# Patient Record
Sex: Female | Born: 1985 | Race: Black or African American | Hispanic: No | Marital: Single | Smoking: Never smoker
Health system: Southern US, Community
[De-identification: ages and names within clinical notes are randomized; demographics above are authoritative.]

## PROBLEM LIST (undated history)

## (undated) DIAGNOSIS — G43109 Migraine with aura, not intractable, without status migrainosus: Secondary | ICD-10-CM

## (undated) DIAGNOSIS — R51 Headache: Secondary | ICD-10-CM

## (undated) DIAGNOSIS — R519 Headache, unspecified: Secondary | ICD-10-CM

## (undated) HISTORY — DX: Headache, unspecified: R51.9

## (undated) HISTORY — DX: Migraine with aura, not intractable, without status migrainosus: G43.109

## (undated) HISTORY — DX: Headache: R51

## (undated) HISTORY — PX: WISDOM TOOTH EXTRACTION: SHX21

---

## 2012-03-02 ENCOUNTER — Emergency Department: Payer: Self-pay | Admitting: Emergency Medicine

## 2012-05-17 ENCOUNTER — Emergency Department: Payer: Self-pay | Admitting: Emergency Medicine

## 2012-05-17 LAB — URINALYSIS, COMPLETE
Bilirubin,UR: NEGATIVE
Glucose,UR: NEGATIVE mg/dL (ref 0–75)
Ketone: NEGATIVE
Ph: 7 (ref 4.5–8.0)
Specific Gravity: 1.015 (ref 1.003–1.030)
Squamous Epithelial: 2
WBC UR: 258 /HPF (ref 0–5)

## 2016-03-20 ENCOUNTER — Encounter: Payer: Self-pay | Admitting: Obstetrics and Gynecology

## 2016-04-04 ENCOUNTER — Ambulatory Visit (INDEPENDENT_AMBULATORY_CARE_PROVIDER_SITE_OTHER): Payer: BC Managed Care – PPO | Admitting: Obstetrics and Gynecology

## 2016-04-04 ENCOUNTER — Encounter: Payer: Self-pay | Admitting: Obstetrics and Gynecology

## 2016-04-04 VITALS — BP 89/49 | HR 75 | Ht 59.0 in | Wt 90.3 lb

## 2016-04-04 DIAGNOSIS — G4459 Other complicated headache syndrome: Secondary | ICD-10-CM | POA: Diagnosis not present

## 2016-04-04 DIAGNOSIS — Z803 Family history of malignant neoplasm of breast: Secondary | ICD-10-CM | POA: Diagnosis not present

## 2016-04-04 DIAGNOSIS — Z01419 Encounter for gynecological examination (general) (routine) without abnormal findings: Secondary | ICD-10-CM | POA: Diagnosis not present

## 2016-04-04 MED ORDER — AMITRIPTYLINE HCL 10 MG PO TABS: 10.0000 mg | ORAL_TABLET | Freq: Every day | ORAL | 2 refills | Status: DC

## 2016-04-04 NOTE — Patient Instructions (Addendum)
 Preventive Care 18-39 Years, Female Preventive care refers to lifestyle choices and visits with your health care provider that can promote health and wellness. What does preventive care include?  A yearly physical exam. This is also called an annual well check.  Dental exams once or twice a year.  Routine eye exams. Ask your health care provider how often you should have your eyes checked.  Personal lifestyle choices, including:  Daily care of your teeth and gums.  Regular physical activity.  Eating a healthy diet.  Avoiding tobacco and drug use.  Limiting alcohol use.  Practicing safe sex.  Taking vitamin and mineral supplements as recommended by your health care provider. What happens during an annual well check? The services and screenings done by your health care provider during your annual well check will depend on your age, overall health, lifestyle risk factors, and family history of disease. Counseling  Your health care provider may ask you questions about your:  Alcohol use.  Tobacco use.  Drug use.  Emotional well-being.  Home and relationship well-being.  Sexual activity.  Eating habits.  Work and work environment.  Method of birth control.  Menstrual cycle.  Pregnancy history. Screening  You may have the following tests or measurements:  Height, weight, and BMI.  Diabetes screening. This is done by checking your blood sugar (glucose) after you have not eaten for a while (fasting).  Blood pressure.  Lipid and cholesterol levels. These may be checked every 5 years starting at age 20.  Skin check.  Hepatitis C blood test.  Hepatitis B blood test.  Sexually transmitted disease (STD) testing.  BRCA-related cancer screening. This may be done if you have a family history of breast, ovarian, tubal, or peritoneal cancers.  Pelvic exam and Pap test. This may be done every 3 years starting at age 21. Starting at age 30, this may be done  every 5 years if you have a Pap test in combination with an HPV test. Discuss your test results, treatment options, and if necessary, the need for more tests with your health care provider. Vaccines  Your health care provider may recommend certain vaccines, such as:  Influenza vaccine. This is recommended every year.  Tetanus, diphtheria, and acellular pertussis (Tdap, Td) vaccine. You may need a Td booster every 10 years.  Varicella vaccine. You may need this if you have not been vaccinated.  HPV vaccine. If you are 26 or younger, you may need three doses over 6 months.  Measles, mumps, and rubella (MMR) vaccine. You may need at least one dose of MMR. You may also need a second dose.  Pneumococcal 13-valent conjugate (PCV13) vaccine. You may need this if you have certain conditions and were not previously vaccinated.  Pneumococcal polysaccharide (PPSV23) vaccine. You may need one or two doses if you smoke cigarettes or if you have certain conditions.  Meningococcal vaccine. One dose is recommended if you are age 19-21 years and a first-year college student living in a residence hall, or if you have one of several medical conditions. You may also need additional booster doses.  Hepatitis A vaccine. You may need this if you have certain conditions or if you travel or work in places where you may be exposed to hepatitis A.  Hepatitis B vaccine. You may need this if you have certain conditions or if you travel or work in places where you may be exposed to hepatitis B.  Haemophilus influenzae type b (Hib) vaccine. You may need   this if you have certain risk factors. Talk to your health care provider about which screenings and vaccines you need and how often you need them. This information is not intended to replace advice given to you by your health care provider. Make sure you discuss any questions you have with your health care provider. Document Released: 06/19/2001 Document Revised:  01/11/2016 Document Reviewed: 02/22/2015 Elsevier Interactive Patient Education  2017 Elsevier Inc.  

## 2016-04-04 NOTE — Progress Notes (Signed)
GYNECOLOGY ANNUAL PHYSICAL EXAM PROGRESS NOTE  Subjective:    Abigail Johnston is a 30 y.o. G1P0010 single female who presents to establish care, and for an annual exam. . The patient is sexually active. The patient wears seatbelts: yes. The patient participates in regular exercise: yes (recently initiated). Has the patient ever been transfused or tattooed?: no. The patient reports that there is not domestic violence in her life.    The patient wishes to address the following complaints today.  1. Sporadic headaches, several days per week, has been ongoing x 5 years.  Has worked on reducing stress levels, has had eye exams, dental exams. Uses OTC ibuprofen which helps most of the time.  Notes h/o migraines with aura (sees spots) as well occasionally, but these headaches are different.   Gynecologic History Menarche age: 41  Patient's last menstrual period was 03/28/2016. Period Cycle (Days): 28 Period Duration (Days): 5-7 Period Pattern: Regular Menstrual Flow: Moderate Dysmenorrhea: (!) Mild Dysmenorrhea Symptoms: Cramping  Contraception: condoms History of STI's: Denies Last Pap: 2 years ago (performed at [facility]). Results were: normal.  Denies h/o abnormal pap smears.    Obstetric History   G1   P0   T0   P0   A1   L0    SAB0   TAB0   Ectopic0   Multiple0   Live Births0     # Outcome Date GA Lbr Len/2nd Weight Sex Delivery Anes PTL Lv  1 AB 2012              Past Medical History:  Diagnosis Date  . Headache     Past Surgical History:  Procedure Laterality Date  . WISDOM TOOTH EXTRACTION      Family History  Problem Relation Age of Onset  . Breast cancer Maternal Grandmother 68    Social History   Social History  . Marital status: Single    Spouse name: N/A  . Number of children: N/A  . Years of education: N/A   Occupational History  . Not on file.   Social History Main Topics  . Smoking status: Never Smoker  . Smokeless  tobacco: Never Used  . Alcohol use No  . Drug use: No  . Sexual activity: Yes    Birth control/ protection: Condom   Other Topics Concern  . Not on file   Social History Narrative  . No narrative on file    No current outpatient prescriptions on file prior to visit.   No current facility-administered medications on file prior to visit.     No Known Allergies   Review of Systems Constitutional: negative for chills, fatigue, fevers and sweats Eyes: negative for irritation, redness and visual disturbance Ears, nose, mouth, throat, and face: negative for hearing loss, nasal congestion, snoring and tinnitus Respiratory: negative for asthma, cough, sputum Cardiovascular: negative for chest pain, dyspnea, exertional chest pressure/discomfort, irregular heart beat, palpitations and syncope Gastrointestinal: negative for abdominal pain, change in bowel habits, nausea and vomiting Genitourinary: negative for abnormal menstrual periods, genital lesions, sexual problems and vaginal discharge, dysuria and urinary incontinence Integument/breast: negative for breast lump, breast tenderness and nipple discharge Hematologic/lymphatic: negative for bleeding and easy bruising Musculoskeletal:negative for back pain and muscle weakness Neurological: positive for headaches.  Negative for dizziness, vertigo and weakness Endocrine: negative for diabetic symptoms including polydipsia, polyuria and skin dryness Allergic/Immunologic: negative for hay fever and urticaria        Objective:  Blood pressure Marland Kitchen)  89/49, pulse 75, height 4' 11"  (1.499 m), weight 90 lb 4.8 oz (41 kg), last menstrual period 03/28/2016. Body mass index is 18.24 kg/m.  General Appearance:    Alert, cooperative, no distress, appears stated age  Head:    Normocephalic, without obvious abnormality, atraumatic  Eyes:    PERRL, conjunctiva/corneas clear, EOM's intact, both eyes  Ears:    Normal external ear canals, both ears    Nose:   Nares normal, septum midline, mucosa normal, no drainage or sinus tenderness  Throat:   Lips, mucosa, and tongue normal; teeth and gums normal  Neck:   Supple, symmetrical, trachea midline, no adenopathy; thyroid: no enlargement/tenderness/nodules; no carotid bruit or JVD  Back:     Symmetric, no curvature, ROM normal, no CVA tenderness  Lungs:     Clear to auscultation bilaterally, respirations unlabored  Chest Wall:    No tenderness or deformity   Heart:    Regular rate and rhythm, S1 and S2 normal, no murmur, rub or gallop  Breast Exam:    No tenderness, masses, or nipple abnormality  Abdomen:     Soft, non-tender, bowel sounds active all four quadrants, no masses, no organomegaly.    Genitalia:    Pelvic:external genitalia normal, vagina without lesions, discharge, or tenderness, rectovaginal septum  normal. Cervix normal in appearance, no cervical motion tenderness, no adnexal masses or tenderness.  Uterus normal size, shape, mobile, regular contours, nontender.  Rectal:    Normal external sphincter.  No hemorrhoids appreciated. Internal exam not done.   Extremities:   Extremities normal, atraumatic, no cyanosis or edema  Pulses:   2+ and symmetric all extremities  Skin:   Skin color, texture, turgor normal, no rashes or lesions  Lymph nodes:   Cervical, supraclavicular, and axillary nodes normal  Neurologic:   CNII-XII intact, normal strength, sensation and reflexes throughout    Labs:  No results found for: WBC, HGB, HCT, MCV, PLT  No results found for: CREATININE, BUN, NA, K, CL, CO2  No results found for: ALT, AST, GGT, ALKPHOS, BILITOT   Assessment:     Routine gynecologic exam.   Persistent headaches Family h/o breast cancer  Plan:     Blood tests: CBC with diff and Comprehensive metabolic panel. Breast self exam technique reviewed and patient encouraged to perform self-exam monthly. Contraception: condoms. Discussed healthy lifestyle modifications. Pap  smear. Up to date, next needed in 2018.  Declines flu vaccine Declines STI testing Amitriptyline prescribed for headache prophylaxis, may need referral to Neurologist.  Family h/o cancer, breast cancer in maternal grandmother at young age.  Discussed that patient's risk might be elevated above general population.  Recommended that mother be tested for hereditary cancers (more specifically BRCA).   As patient is second degree relative, may not qualify for the genetic testing.  To f/u in 1 year for annual exam. F/u in 6 weeks to reassess headaches.     Rubie Maid, MD [facility]

## 2016-04-05 LAB — COMPREHENSIVE METABOLIC PANEL
ALT: 14 IU/L (ref 0–32)
AST: 31 IU/L (ref 0–40)
Albumin/Globulin Ratio: 1.6 (ref 1.2–2.2)
Albumin: 4.2 g/dL (ref 3.5–5.5)
Alkaline Phosphatase: 47 IU/L (ref 39–117)
BUN/Creatinine Ratio: 15 (ref 9–23)
BUN: 12 mg/dL (ref 6–20)
Bilirubin Total: 1.1 mg/dL (ref 0.0–1.2)
CO2: 23 mmol/L (ref 18–29)
Calcium: 9 mg/dL (ref 8.7–10.2)
Chloride: 104 mmol/L (ref 96–106)
Creatinine, Ser: 0.78 mg/dL (ref 0.57–1.00)
GFR calc Af Amer: 117 mL/min/{1.73_m2} (ref >59)
GFR, EST NON AFRICAN AMERICAN: 102 mL/min/{1.73_m2} (ref >59)
GLOBULIN, TOTAL: 2.7 g/dL (ref 1.5–4.5)
Glucose: 84 mg/dL (ref 65–99)
Potassium: 4 mmol/L (ref 3.5–5.2)
SODIUM: 141 mmol/L (ref 134–144)
Total Protein: 6.9 g/dL (ref 6.0–8.5)

## 2016-04-05 LAB — CBC
Hematocrit: 38.8 % (ref 34.0–46.6)
Hemoglobin: 13.2 g/dL (ref 11.1–15.9)
MCH: 31.2 pg (ref 26.6–33.0)
MCHC: 34 g/dL (ref 31.5–35.7)
MCV: 92 fL (ref 79–97)
Platelets: 274 10*3/uL (ref 150–379)
RBC: 4.23 x10E6/uL (ref 3.77–5.28)
RDW: 13.7 % (ref 12.3–15.4)
WBC: 3.9 10*3/uL (ref 3.4–10.8)

## 2016-04-05 LAB — VITAMIN D 25 HYDROXY (VIT D DEFICIENCY, FRACTURES): Vit D, 25-Hydroxy: 13.5 ng/mL — ABNORMAL LOW (ref 30.0–100.0)

## 2016-04-09 ENCOUNTER — Telehealth: Payer: Self-pay

## 2016-04-09 DIAGNOSIS — E559 Vitamin D deficiency, unspecified: Secondary | ICD-10-CM

## 2016-04-09 MED ORDER — VITAMIN D (ERGOCALCIFEROL) 1.25 MG (50000 UNIT) PO CAPS: 50000.0000 [IU] | ORAL_CAPSULE | ORAL | 0 refills | Status: DC

## 2016-04-09 NOTE — Telephone Encounter (Signed)
Called pt no answer. LM for pt. RX sent in.

## 2016-04-09 NOTE — Telephone Encounter (Signed)
-----   Message from Hildred LaserAnika [name], MD sent at 04/06/2016  9:09 AM EST ----- Please inform patient of Vitamin D deficiency.  Needs treatment 50,000 IU of Vit D weekly x 12 weeks, followed by OTC [phone] IU daily. Will need to recheck levels in 3 months.  This could also be a potential cause of her headaches as Vit D deficiency can be a cause of vague symptoms and general aches and pains, in addition to skeletal problems.

## 2016-06-06 ENCOUNTER — Encounter: Payer: Self-pay | Admitting: Obstetrics and Gynecology

## 2016-06-06 ENCOUNTER — Ambulatory Visit (INDEPENDENT_AMBULATORY_CARE_PROVIDER_SITE_OTHER): Payer: BC Managed Care – PPO | Admitting: Obstetrics and Gynecology

## 2016-06-06 VITALS — BP 73/46 | HR 76 | Ht 59.0 in | Wt 90.5 lb

## 2016-06-06 DIAGNOSIS — R51 Headache: Secondary | ICD-10-CM | POA: Diagnosis not present

## 2016-06-06 DIAGNOSIS — E559 Vitamin D deficiency, unspecified: Secondary | ICD-10-CM | POA: Diagnosis not present

## 2016-06-06 DIAGNOSIS — I950 Idiopathic hypotension: Secondary | ICD-10-CM

## 2016-06-06 DIAGNOSIS — R519 Headache, unspecified: Secondary | ICD-10-CM

## 2016-06-06 NOTE — Progress Notes (Signed)
    GYNECOLOGY PROGRESS NOTE  Subjective:    Patient ID: Abigail Johnston, female    DOB: 11/10/1985, 31 y.o.   MRN: [id number]  HPI  Patient is a 31 y.o. 791P0010 female who presents for 2 month f/u of persistent headaches.  Was started on Elavil last visit, however notes that she discontinued after approximately 1 week as it intensified her headaches.  Currently notes that she is still having headaches, but not as often.   Of note, patient is doing well on Vit d supplementation. Will need levels rechecked next month.   The following portions of the patient's history were reviewed and updated as appropriate: allergies, current medications, past family history, past medical history, past social history, past surgical history and problem list.  Review of Systems Pertinent items noted in HPI and remainder of comprehensive ROS otherwise negative.   Objective:   Blood pressure (!) 73/46, pulse 76, height 4\' 11"  (1.499 m), weight 90 lb 8 oz (41.1 kg), last menstrual period 05/28/2016. General appearance: alert and no distress Remainder of exam deferred.    Assessment:   Persistent (almost daily)  Headaches Hypotension Vit D deficiency  Plan:   Discussed with patient that she would likely benefit from a Neurology referral.  Will place order.  Hypotension - review of chart notes that this is patient's baseline.  Denies symptoms.   Vit D deficiency - patient to f/u in 1 month after completion of therapy for repeat labs.   Abigail LaserAnika Lopez Dentinger, MD [facility]

## 2016-06-08 DIAGNOSIS — E559 Vitamin D deficiency, unspecified: Secondary | ICD-10-CM | POA: Insufficient documentation

## 2016-06-08 DIAGNOSIS — R519 Headache, unspecified: Secondary | ICD-10-CM | POA: Insufficient documentation

## 2016-06-08 DIAGNOSIS — R51 Headache: Principal | ICD-10-CM

## 2016-06-08 DIAGNOSIS — I950 Idiopathic hypotension: Secondary | ICD-10-CM | POA: Insufficient documentation

## 2016-07-04 ENCOUNTER — Other Ambulatory Visit: Payer: BC Managed Care – PPO

## 2016-07-04 DIAGNOSIS — E559 Vitamin D deficiency, unspecified: Secondary | ICD-10-CM

## 2016-07-05 ENCOUNTER — Telehealth: Payer: Self-pay

## 2016-07-05 LAB — VITAMIN D 25 HYDROXY (VIT D DEFICIENCY, FRACTURES): Vit D, 25-Hydroxy: 56.8 ng/mL (ref 30.0–100.0)

## 2016-07-05 NOTE — Telephone Encounter (Signed)
Called pt informed her of information below, pt gave verbal understanding. Sent text to sign up for mychart.

## 2016-07-05 NOTE — Telephone Encounter (Signed)
-----   Message from Hildred LaserAnika [name], MD sent at 07/05/2016  8:34 AM EST ----- Please inform patient that her Vit D levels are now normal.  She can now just take a regular OTC supplement daily.

## 2016-07-09 ENCOUNTER — Other Ambulatory Visit: Payer: Self-pay

## 2016-07-17 ENCOUNTER — Encounter: Payer: Self-pay | Admitting: Neurology

## 2016-07-17 ENCOUNTER — Ambulatory Visit (INDEPENDENT_AMBULATORY_CARE_PROVIDER_SITE_OTHER): Payer: BC Managed Care – PPO | Admitting: Neurology

## 2016-07-17 VITALS — BP 92/58 | HR 78 | Ht 59.0 in | Wt 90.3 lb

## 2016-07-17 DIAGNOSIS — G43109 Migraine with aura, not intractable, without status migrainosus: Secondary | ICD-10-CM

## 2016-07-17 DIAGNOSIS — G44229 Chronic tension-type headache, not intractable: Secondary | ICD-10-CM

## 2016-07-17 MED ORDER — NORTRIPTYLINE HCL 10 MG PO CAPS
10.0000 mg | ORAL_CAPSULE | Freq: Every day | ORAL | 3 refills | Status: DC
Start: 2016-07-17 — End: 2016-12-12

## 2016-07-17 NOTE — Patient Instructions (Signed)
  1.  Start nortriptyline 10mg  at bedtime.  Call in 4 weeks with update and we can adjust dose if needed. 2.  Take ibuprofen as needed for headache but limited to no more than 2 days out of the week. 3.  Limit use of pain relievers to no more than 2 days out of the week.  These medications include acetaminophen, ibuprofen, triptans and narcotics.  This will help reduce risk of rebound headaches. 4.  Be aware of common food triggers such as processed sweets, processed foods with nitrites (such as deli meat, hot dogs, sausages), foods with MSG, alcohol (such as wine), chocolate, certain cheeses, certain fruits (dried fruits, some citrus fruit), vinegar, diet soda. 4.  Avoid caffeine 5.  Routine exercise 6.  Proper sleep hygiene 7.  Stay adequately hydrated with water 8.  Keep a headache diary. 9.  Maintain proper stress management. 10.  Do not skip meals. 11.  Consider supplements:  Magnesium citrate 400mg  to 600mg  daily, riboflavin 400mg , Coenzyme Q 10 100mg  three times daily 12.  Follow up in 3 months.

## 2016-07-17 NOTE — Progress Notes (Signed)
NEUROLOGY CONSULTATION NOTE  Abigail Johnston MRN: [id number] DOB: 10-27-1985  Referring provider: Dr. Valentino Saxon Primary care provider: Dr. Valentino Saxon  Reason for consult:  headache  HISTORY OF PRESENT ILLNESS: Abigail Johnston is a 31 year old female who presents for headache.  History supplemented by PCP note.  Onset:  adolescence Location:  Varies (back of head, temples, front) Quality:  nonthrobbing Intensity:  7/10 Aura:  no Prodrome:  no Associated symptoms:  No.  She has not had any new worse headache of her life, waking up from sleep Duration:  1 hour with ibuprofen Frequency:  4 days a week Frequency of abortive medication: 4 days a week Triggers/exacerbating factors:  Loud sounds Relieving factors:  Laying down Activity:  Functions.  She also has had infrequent migraines (total of 3 in her life).  They are 9-10/10 pounding and associated with seeing scotomas, nausea, vomiting, photophobia and phonophobia.   Past NSAIDS:  no Past analgesics:  Excedrin Past abortive triptans:  no Past muscle relaxants:  no Past anti-emetic:  no Past antihypertensive medications:  no Past antidepressant medications:  Amitriptyline 10mg  (for 1 week, made her feel worse) Past anticonvulsant medications:  no Past vitamins/Herbal/Supplements:  no Past antihistamines/decongestants:  no Other past therapies:  no  Current NSAIDS:  ibuprofen Current analgesics:  no Current triptans:  no Current anti-emetic:  no Current muscle relaxants:  no Current anti-anxiolytic:  no Current sleep aide:  no Current Antihypertensive medications:  no Current Antidepressant medications:  no Current Anticonvulsant medications:  no Current Vitamins/Herbal/Supplements:  vitamin D Current Antihistamines/Decongestants:  no Other therapy:  no  Caffeine:  soda Alcohol:  no Smoker:  no Diet:  Does not hydrate Exercise:  no Depression/anxiety:  no Sleep hygiene:  good Family history of headache:   no  Labs from 04/04/16:  CMP with Na 141, K 4, Cl 104, CO2 23, glucose 84, BUN 12, Cr 0.78, Total bili 1.1, ALP 47, AST 31, ALT 14.  PAST MEDICAL HISTORY: Past Medical History:  Diagnosis Date  . Headache   . Migraine headache with aura     PAST SURGICAL HISTORY: Past Surgical History:  Procedure Laterality Date  . WISDOM TOOTH EXTRACTION      MEDICATIONS: Current Outpatient Prescriptions on File Prior to Visit  Medication Sig Dispense Refill  . ibuprofen (ADVIL,MOTRIN) 200 MG tablet Take 200 mg by mouth every 6 (six) hours as needed.    Marland Kitchen amitriptyline (ELAVIL) 10 MG tablet Take 1 tablet (10 mg total) by mouth at bedtime. (Patient not taking: Reported on 06/06/2016) 30 tablet 2  . Vitamin D, Ergocalciferol, (DRISDOL) 50000 units CAPS capsule Take 1 capsule (50,000 Units total) by mouth every 7 (seven) days. (Patient not taking: Reported on 07/17/2016) 12 capsule 0   No current facility-administered medications on file prior to visit.     ALLERGIES: No Known Allergies  FAMILY HISTORY: Family History  Problem Relation Age of Onset  . Breast cancer Maternal Grandmother     SOCIAL HISTORY: Social History   Social History  . Marital status: Single    Spouse name: N/A  . Number of children: N/A  . Years of education: N/A   Occupational History  . Not on file.   Social History Main Topics  . Smoking status: Never Smoker  . Smokeless tobacco: Never Used  . Alcohol use No  . Drug use: No  . Sexual activity: Yes    Birth control/ protection: Condom   Other Topics Concern  . Not  on file   Social History Narrative  . No narrative on file    REVIEW OF SYSTEMS: Constitutional: No fevers, chills, or sweats, no generalized fatigue, change in appetite Eyes: No visual changes, double vision, eye pain Ear, nose and throat: No hearing loss, ear pain, nasal congestion, sore throat Cardiovascular: No chest pain, palpitations Respiratory:  No shortness of breath at rest or  with exertion, wheezes GastrointestinaI: No nausea, vomiting, diarrhea, abdominal pain, fecal incontinence Genitourinary:  No dysuria, urinary retention or frequency Musculoskeletal:  No neck pain, back pain Integumentary: No rash, pruritus, skin lesions Neurological: as above Psychiatric: No depression, insomnia, anxiety Endocrine: No palpitations, fatigue, diaphoresis, mood swings, change in appetite, change in weight, increased thirst Hematologic/Lymphatic:  No purpura, petechiae. Allergic/Immunologic: no itchy/runny eyes, nasal congestion, recent allergic reactions, rashes  PHYSICAL EXAM: Vitals:   07/17/16 1341  BP: (!) 92/58  Pulse: 78   General: No acute distress.  Patient appears well-groomed.  Head:  Normocephalic/atraumatic Eyes:  fundi examined but not visualized Neck: supple, no paraspinal tenderness, full range of motion Back: No paraspinal tenderness Heart: regular rate and rhythm Lungs: Clear to auscultation bilaterally. Vascular: No carotid bruits. Neurological Exam: Mental status: alert and oriented to person, place, and time, recent and remote memory intact, fund of knowledge intact, attention and concentration intact, speech fluent and not dysarthric, language intact. Cranial nerves: CN I: not tested CN II: pupils equal, round and reactive to light, visual fields intact CN III, IV, VI:  full range of motion, no nystagmus, no ptosis CN V: facial sensation intact CN VII: upper and lower face symmetric CN VIII: hearing intact CN IX, X: gag intact, uvula midline CN XI: sternocleidomastoid and trapezius muscles intact CN XII: tongue midline Bulk & Tone: normal, no fasciculations. Motor:  5/5 throughout  Sensation: temperature and vibration sensation intact. Deep Tendon Reflexes:  2+ throughout, toes downgoing.  Finger to nose testing:  Without dysmetria.  Heel to shin:  Without dysmetria.  Gait:  Normal station and stride.  Able to turn and tandem walk.  Romberg negative.  IMPRESSION: 1.  Chronic tension-type headache 2.  Migraine with aura, infrequent  PLAN: 1.  Will try nortriptyline 10mg  at bedtime (better tolerated than amitriptyline) 2.  Limit ibuprofen to no more than 2 days out of the week 3.  Increase exercise and water, stop soda 4.  Follow up in 3 months.  Thank you for allowing me to take part in the care of this patient.  Shon MilletAdam Aunesty Tyson, DO  CC:  Hildred LaserAnika [name], MD

## 2016-10-11 ENCOUNTER — Ambulatory Visit (INDEPENDENT_AMBULATORY_CARE_PROVIDER_SITE_OTHER): Payer: BC Managed Care – PPO | Admitting: Obstetrics and Gynecology

## 2016-10-11 ENCOUNTER — Encounter: Payer: Self-pay | Admitting: Obstetrics and Gynecology

## 2016-10-11 VITALS — BP 85/56 | HR 81 | Ht 59.0 in | Wt 95.6 lb

## 2016-10-11 DIAGNOSIS — Z30011 Encounter for initial prescription of contraceptive pills: Secondary | ICD-10-CM

## 2016-10-11 NOTE — Patient Instructions (Addendum)
Oral Contraception Use Oral contraceptive pills (OCPs) are medicines taken to prevent pregnancy. OCPs work by preventing the ovaries from releasing eggs. The hormones in OCPs also cause the cervical mucus to thicken, preventing the sperm from entering the uterus. The hormones also cause the uterine lining to become thin, not allowing a fertilized egg to attach to the inside of the uterus. OCPs are highly effective when taken exactly as prescribed. However, OCPs do not prevent sexually transmitted diseases (STDs). Safe sex practices, such as using condoms along with an OCP, can help prevent STDs. Before taking OCPs, you may have a physical exam and Pap test. Your health care provider may also order blood tests if necessary. Your health care provider will make sure you are a good candidate for oral contraception. Discuss with your health care provider the possible side effects of the OCP you may be prescribed. When starting an OCP, it can take 2 to 3 months for the body to adjust to the changes in hormone levels in your body. How to take oral contraceptive pills Your health care provider may advise you on how to start taking the first cycle of OCPs. Otherwise, you can:  Start on day 1 of your menstrual period. You will not need any backup contraceptive protection with this start time.  Start on the first Sunday after your menstrual period or the day you get your prescription. In these cases, you will need to use backup contraceptive protection for the first week.  Start the pill at any time of your cycle. If you take the pill within 5 days of the start of your period, you are protected against pregnancy right away. In this case, you will not need a backup form of birth control. If you start at any other time of your menstrual cycle, you will need to use another form of birth control for 7 days. If your OCP is the type called a minipill, it will protect you from pregnancy after taking it for 2 days (48  hours).  After you have started taking OCPs:  If you forget to take 1 pill, take it as soon as you remember. Take the next pill at the regular time.  If you miss 2 or more pills, call your health care provider because different pills have different instructions for missed doses. Use backup birth control until your next menstrual period starts.  If you use a 28-day pack that contains inactive pills and you miss 1 of the last 7 pills (pills with no hormones), it will not matter. Throw away the rest of the non-hormone pills and start a new pill pack.  No matter which day you start the OCP, you will always start a new pack on that same day of the week. Have an extra pack of OCPs and a backup contraceptive method available in case you miss some pills or lose your OCP pack. Follow these instructions at home:  Do not smoke.  Always use a condom to protect against STDs. OCPs do not protect against STDs.  Use a calendar to mark your menstrual period days.  Read the information and directions that came with your OCP. Talk to your health care provider if you have questions. Contact a health care provider if:  You develop nausea and vomiting.  You have abnormal vaginal discharge or bleeding.  You develop a rash.  You miss your menstrual period.  You are losing your hair.  You need treatment for mood swings or depression.  You   get dizzy when taking the OCP.  You develop acne from taking the OCP.  You become pregnant. Get help right away if:  You develop chest pain.  You develop shortness of breath.  You have an uncontrolled or severe headache.  You develop numbness or slurred speech.  You develop visual problems.  You develop pain, redness, and swelling in the legs. This information is not intended to replace advice given to you by your health care provider. Make sure you discuss any questions you have with your health care provider. Document Released: 04/12/2011 Document  Revised: 09/29/2015 Document Reviewed: 10/12/2012 Elsevier Interactive Patient Education  2017 [employer]  

## 2016-10-11 NOTE — Progress Notes (Signed)
    GYNECOLOGY CLINIC PROGRESS NOTE Subjective:    Abigail Johnston is a 31 y.o. 441P0010 female who presents for contraception counseling. The patient has no complaints today. The patient is not sexually active. Pertinent past medical history: none (had persistent headaches but now resolved after having vision assessment, no h/o migraines).  Menstrual History: OB History    Gravida Para Term Preterm AB Living   1       1     SAB TAB Ectopic Multiple Live Births                  Menarche age: [age] Patient's last menstrual period was 09/15/2016.    The following portions of the patient's history were reviewed and updated as appropriate: allergies, current medications, past family history, past medical history, past social history, past surgical history and problem list.  Review of Systems Pertinent items noted in HPI and remainder of comprehensive ROS otherwise negative.   Objective:    BP (!) 85/56 (BP Location: Left Arm, Patient Position: Sitting, Cuff Size: Normal)   Pulse 81   Ht 4\' 11"  (1.499 m)   Wt 95 lb 9.6 oz (43.4 kg)   LMP 09/15/2016   BMI 19.31 kg/m  General appearance: alert and no distress Head: Normocephalic, without obvious abnormality, atraumatic Neck: no adenopathy, no carotid bruit, no JVD, supple, symmetrical, trachea midline and thyroid not enlarged, symmetric, no tenderness/mass/nodules Lungs: clear to auscultation bilaterally Heart: regular rate and rhythm, S1, S2 normal, no murmur, click, rub or gallop Abdomen: soft, non-tender; bowel sounds normal; no masses,  no organomegaly Pelvic: deferred Extremities: extremities normal, atraumatic, no cyanosis or edema Neurologic: Grossly normal   Assessment:    31 y.o., starting OCP (estrogen/progesterone), no contraindications.   Plan:   - Contraception: OCP (estrogen/progesterone). Discussed risks and benefits of OCPs. Reviewed all other forms of birth control.  Given samples of Taytulla x 2 months.  Advised on back up contraception during use of samples due to recent recall (batch with switched placebo and active row).  If OCPs work for patient, can prescribe alternative brand. Advised on Sunday start as next menses is due sometime next week.  - F/u in 2 months to reassess contraception.    Hildred Laserherry, Dorothey Oetken, MD [facility] =

## 2016-10-18 ENCOUNTER — Ambulatory Visit: Payer: BC Managed Care – PPO | Admitting: Neurology

## 2016-12-12 ENCOUNTER — Encounter: Payer: Self-pay | Admitting: Obstetrics and Gynecology

## 2016-12-12 ENCOUNTER — Ambulatory Visit (INDEPENDENT_AMBULATORY_CARE_PROVIDER_SITE_OTHER): Payer: BC Managed Care – PPO | Admitting: Obstetrics and Gynecology

## 2016-12-12 VITALS — BP 78/52 | HR 83 | Ht 59.0 in | Wt 94.3 lb

## 2016-12-12 DIAGNOSIS — T887XXA Unspecified adverse effect of drug or medicament, initial encounter: Secondary | ICD-10-CM

## 2016-12-12 DIAGNOSIS — Z3041 Encounter for surveillance of contraceptive pills: Secondary | ICD-10-CM

## 2016-12-12 DIAGNOSIS — T50905A Adverse effect of unspecified drugs, medicaments and biological substances, initial encounter: Secondary | ICD-10-CM

## 2016-12-12 NOTE — Progress Notes (Signed)
    GYNECOLOGY PROGRESS NOTE  Subjective:    Patient ID: Abigail Johnston, female    DOB: 08/18/1985, 31 y.o.   MRN: [id number]  HPI  Patient is a 31 y.o. [id number] female who presents for 2 month f/u of contraception.  She was given samples of Taytulla last visit.  Patient notes that she experiences nausea almost daily, even despite now taking the pills at night.  This is very bothersome as the nausea lasts all day. Denies other symptoms.  Has no issues with compliance.   The following portions of the patient's history were reviewed and updated as appropriate: allergies, current medications, past family history, past medical history, past social history, past surgical history and problem list.  Review of Systems Pertinent items noted in HPI and remainder of comprehensive ROS otherwise negative.       Objective:   Blood pressure (!) 78/52, pulse 83, height 4\' 11"  (1.499 m), weight 94 lb 4.8 oz (42.8 kg), last menstrual period 11/11/2016. General appearance: alert and no distress Exam deferred.   Assessment:   Side effects of medication Contraception management.   Plan:   Discussed options for management, including changing to a different OCP, or switching to a different birth control method.  Patient would lie to stick with pills for now.  Changed to Lo-Loestrin (given 2 month samples).  If this works better, patient can call for prescription.  If not, also given handout on different options for contraception, including abstinence; over the counter/barrier methods; hormonal contraceptive medication including pill (possibly progesterone only), patch, ring, injection,contraceptive implant; hormonal and nonhormonal IUDs.  Risks and benefits reviewed.  Questions were answered.  Information was given to patient to review.   Follow up if symptoms worsen or fail to improve.    Hildred Laserherry, Elmus Mathes, MD [facility]

## 2017-02-04 ENCOUNTER — Telehealth: Payer: Self-pay | Admitting: Obstetrics and Gynecology

## 2017-02-04 MED ORDER — NORETHIN-ETH ESTRAD-FE BIPHAS 1 MG-10 MCG / 10 MCG PO TABS: 1.0000 | ORAL_TABLET | Freq: Every day | ORAL | 1 refills | Status: DC

## 2017-02-04 NOTE — Telephone Encounter (Signed)
Med erx with 6 refills. Annual exam scheduled for 12/7 at 8am.

## 2017-02-04 NOTE — Telephone Encounter (Signed)
Patient called and stated that she was advised to call if her recent birth sample is working for her, The patient stated that (lo lo estrin) is working great for her and that she has about 1 week left, Then she will need more. The patient did not disclose any other information other than wanting a prescription of the birth control/ Please advise.

## 2017-02-04 NOTE — Telephone Encounter (Signed)
lmtrc

## 2017-04-12 ENCOUNTER — Encounter: Payer: BC Managed Care – PPO | Admitting: Obstetrics and Gynecology

## 2017-05-09 ENCOUNTER — Ambulatory Visit (INDEPENDENT_AMBULATORY_CARE_PROVIDER_SITE_OTHER): Payer: BC Managed Care – PPO | Admitting: Obstetrics and Gynecology

## 2017-05-09 ENCOUNTER — Encounter: Payer: Self-pay | Admitting: Obstetrics and Gynecology

## 2017-05-09 VITALS — BP 91/58 | HR 96 | Ht 59.0 in | Wt 96.4 lb

## 2017-05-09 DIAGNOSIS — Z113 Encounter for screening for infections with a predominantly sexual mode of transmission: Secondary | ICD-10-CM | POA: Diagnosis not present

## 2017-05-09 DIAGNOSIS — Z124 Encounter for screening for malignant neoplasm of cervix: Secondary | ICD-10-CM | POA: Diagnosis not present

## 2017-05-09 DIAGNOSIS — N941 Unspecified dyspareunia: Secondary | ICD-10-CM

## 2017-05-09 DIAGNOSIS — B9689 Other specified bacterial agents as the cause of diseases classified elsewhere: Secondary | ICD-10-CM | POA: Diagnosis not present

## 2017-05-09 DIAGNOSIS — B379 Candidiasis, unspecified: Secondary | ICD-10-CM

## 2017-05-09 DIAGNOSIS — Z01419 Encounter for gynecological examination (general) (routine) without abnormal findings: Secondary | ICD-10-CM

## 2017-05-09 DIAGNOSIS — N760 Acute vaginitis: Secondary | ICD-10-CM | POA: Diagnosis not present

## 2017-05-09 MED ORDER — SECNIDAZOLE 2 G PO PACK: 1.0000 | PACK | Freq: Once | ORAL | 0 refills | Status: AC

## 2017-05-09 MED ORDER — FLUCONAZOLE 150 MG PO TABS: 150.0000 mg | ORAL_TABLET | Freq: Once | ORAL | 3 refills | Status: AC

## 2017-05-09 NOTE — Patient Instructions (Signed)

## 2017-05-09 NOTE — Progress Notes (Signed)
GYNECOLOGY ANNUAL PHYSICAL EXAM PROGRESS NOTE  Subjective:    Abigail Johnston is a 32 y.o. [id number] female who presents for an annual exam. The patient is sexually active. The patient wears seatbelts: yes. The patient participates in regular exercise: no. Has the patient ever been transfused or tattooed?: yes. The patient reports that there is not domestic violence in her life.   The patient has the following complaints today:  1. Dyspareunia with intercourse since October. Is independent of sexual position. Has not changed partners, has not engaged in any new methods or rough intercourse.   Gynecologic History Menarche age: [age] Patient's last menstrual period was 04/12/2017. Contraception: OCP (estrogen/progesterone) History of STI's: Denies Last Pap: cannot recall last pap smear. Results were: normal.  Denies h/o abnormal pap smears.    Obstetric History   G1   P0   T0   P0   A1   L0    SAB0   TAB0   Ectopic0   Multiple0   Live Births0     # Outcome Date GA Lbr Len/2nd Weight Sex Delivery Anes PTL Lv  1 AB 2012              Past Medical History:  Diagnosis Date  . Headache   . Migraine headache with aura     Past Surgical History:  Procedure Laterality Date  . WISDOM TOOTH EXTRACTION      Family History  Problem Relation Age of Onset  . Breast cancer Maternal Grandmother     Social History   Socioeconomic History  . Marital status: Single    Spouse name: Not on file  . Number of children: Not on file  . Years of education: Not on file  . Highest education level: Not on file  Social Needs  . Financial resource strain: Not on file  . Food insecurity - worry: Not on file  . Food insecurity - inability: Not on file  . Transportation needs - medical: Not on file  . Transportation needs - non-medical: Not on file  Occupational History  . Not on file  Tobacco Use  . Smoking status: Never Smoker  . Smokeless tobacco: Never Used  Substance and Sexual  Activity  . Alcohol use: No  . Drug use: No  . Sexual activity: Yes    Birth control/protection: Condom, Pill  Other Topics Concern  . Not on file  Social History Narrative  . Not on file    Current Outpatient Medications on File Prior to Visit  Medication Sig Dispense Refill  . Norethindrone-Ethinyl Estradiol-Fe Biphas (LO LOESTRIN FE) 1 MG-10 MCG / 10 MCG tablet Take 1 tablet by mouth daily. 3 Package 1   No current facility-administered medications on file prior to visit.     No Known Allergies   Review of Systems Constitutional: negative for chills, fatigue, fevers and sweats Eyes: negative for irritation, redness and visual disturbance Ears, nose, mouth, throat, and face: negative for hearing loss, nasal congestion, snoring and tinnitus Respiratory: negative for asthma, cough, sputum Cardiovascular: negative for chest pain, dyspnea, exertional chest pressure/discomfort, irregular heart beat, palpitations and syncope Gastrointestinal: negative for abdominal pain, change in bowel habits, nausea and vomiting Genitourinary: Positive for dyspareunia (since October) and vaginal discharge (x 4 days, mildly itchy).  Negative for abnormal menstrual periods, genital lesions, dysuria and urinary incontinence Integument/breast: negative for breast lump, breast tenderness and nipple discharge Hematologic/lymphatic: negative for bleeding and easy bruising Musculoskeletal:negative for back pain and muscle  weakness Neurological: negative for dizziness, headaches, vertigo and weakness Endocrine: negative for diabetic symptoms including polydipsia, polyuria and skin dryness Allergic/Immunologic: negative for hay fever and urticaria        Objective:  Blood pressure (!) 91/58, pulse 96, height 4\' 11"  (1.499 m), weight 96 lb 6.4 oz (43.7 kg), last menstrual period 04/12/2017. Body mass index is 19.47 kg/m.  General Appearance:    Alert, cooperative, no distress, appears stated age  Head:     Normocephalic, without obvious abnormality, atraumatic  Eyes:    PERRL, conjunctiva/corneas clear, EOM's intact, both eyes  Ears:    Normal external ear canals, both ears  Nose:   Nares normal, septum midline, mucosa normal, no drainage or sinus tenderness  Throat:   Lips, mucosa, and tongue normal; teeth and gums normal  Neck:   Supple, symmetrical, trachea midline, no adenopathy; thyroid: no enlargement/tenderness/nodules; no carotid bruit or JVD  Back:     Symmetric, no curvature, ROM normal, no CVA tenderness  Lungs:     Clear to auscultation bilaterally, respirations unlabored  Chest Wall:    No tenderness or deformity   Heart:    Regular rate and rhythm, S1 and S2 normal, no murmur, rub or gallop  Breast Exam:    No tenderness, masses, or nipple abnormality  Abdomen:     Soft, non-tender, bowel sounds active all four quadrants, no masses, no organomegaly.    Genitalia:    Pelvic:external genitalia normal, vagina without lesions, or tenderness.  Moderate amount of thick clumpy white-gray discharge. Rectovaginal septum  normal. Cervix normal in appearance, no cervical motion tenderness, no adnexal masses or tenderness.  Uterus normal size, shape, mobile, regular contours, nontender.  Rectal:    Normal external sphincter.  No hemorrhoids appreciated. Internal exam not done.   Extremities:   Extremities normal, atraumatic, no cyanosis or edema  Pulses:   2+ and symmetric all extremities  Skin:   Skin color, texture, turgor normal, no rashes or lesions  Lymph nodes:   Cervical, supraclavicular, and axillary nodes normal  Neurologic:   CNII-XII intact, normal strength, sensation and reflexes throughout    Microscopic wet-mount exam shows clue cells, hyphae. hows KOH done. No trichomonads or yeast.    Labs:  Lab Results  Component Value Date   WBC 3.9 04/04/2016   HGB 13.2 04/04/2016   HCT 38.8 04/04/2016   MCV 92 04/04/2016   PLT 274 04/04/2016    Lab Results  Component Value  Date   CREATININE 0.78 04/04/2016   BUN 12 04/04/2016   NA 141 04/04/2016   K 4.0 04/04/2016   CL 104 04/04/2016   CO2 23 04/04/2016    Lab Results  Component Value Date   ALT 14 04/04/2016   AST 31 04/04/2016   ALKPHOS 47 04/04/2016   BILITOT 1.1 04/04/2016    No results found for: TSH   Assessment:    Healthy female exam.   Dyspareunia Bacterial vaginosis Vagina yeast infection Cervical cancer screening  Plan:     Blood tests: CBC with diff and Comprehensive metabolic panel. Breast self exam technique reviewed and patient encouraged to perform self-exam monthly. Contraception: OCP (estrogen/progesterone). Discussed healthy lifestyle modifications. Pap smear performed today.   Prescribed Solozec for bacterial vaginosis, Diflucan for yeast infection.  Dyspareunia likely secondary to vaginitis, but once infections cleared, to notify MD if symptoms persist.      Hildred Laser, MD [facility]

## 2017-05-10 ENCOUNTER — Other Ambulatory Visit: Payer: Self-pay

## 2017-05-10 LAB — PAP IG AND HPV HIGH-RISK
HPV, HIGH-RISK: POSITIVE — AB
PAP SMEAR COMMENT: 0

## 2017-05-10 LAB — COMPREHENSIVE METABOLIC PANEL
ALBUMIN: 4.5 g/dL (ref 3.5–5.5)
ALT: 14 IU/L (ref 0–32)
AST: 17 IU/L (ref 0–40)
Albumin/Globulin Ratio: 1.4 (ref 1.2–2.2)
Alkaline Phosphatase: 33 IU/L — ABNORMAL LOW (ref 39–117)
BUN / CREAT RATIO: 12 (ref 9–23)
BUN: 10 mg/dL (ref 6–20)
Bilirubin Total: 0.7 mg/dL (ref 0.0–1.2)
CALCIUM: 9.5 mg/dL (ref 8.7–10.2)
CO2: 22 mmol/L (ref 20–29)
CREATININE: 0.81 mg/dL (ref 0.57–1.00)
Chloride: 106 mmol/L (ref 96–106)
GFR, EST AFRICAN AMERICAN: 111 mL/min/{1.73_m2} (ref >59)
GFR, EST NON AFRICAN AMERICAN: 96 mL/min/{1.73_m2} (ref >59)
GLOBULIN, TOTAL: 3.2 g/dL (ref 1.5–4.5)
GLUCOSE: 90 mg/dL (ref 65–99)
Potassium: 3.9 mmol/L (ref 3.5–5.2)
SODIUM: 143 mmol/L (ref 134–144)
TOTAL PROTEIN: 7.7 g/dL (ref 6.0–8.5)

## 2017-05-10 LAB — CBC
HEMOGLOBIN: 14.4 g/dL (ref 11.1–15.9)
Hematocrit: 41.1 % (ref 34.0–46.6)
MCH: 31.9 pg (ref 26.6–33.0)
MCHC: 35 g/dL (ref 31.5–35.7)
MCV: 91 fL (ref 79–97)
Platelets: 274 10*3/uL (ref 150–379)
RBC: 4.52 x10E6/uL (ref 3.77–5.28)
RDW: 13.7 % (ref 12.3–15.4)
WBC: 5.2 10*3/uL (ref 3.4–10.8)

## 2017-05-10 MED ORDER — METRONIDAZOLE 0.75 % VA GEL: 1.0000 | Freq: Every day | VAGINAL | 0 refills | Status: DC

## 2017-05-16 LAB — HPV GENOTYPES 16/18,45
HPV GENOTYPE 16: NEGATIVE
HPV GENOTYPE 18,45: NEGATIVE

## 2017-05-16 LAB — SPECIMEN STATUS REPORT

## 2017-05-17 ENCOUNTER — Telehealth: Payer: Self-pay | Admitting: Obstetrics and Gynecology

## 2017-05-17 NOTE — Telephone Encounter (Signed)
The patient called in regards to her results from 05/09/17.The patient would like to have a call back from a nurse.  Please advise.

## 2017-05-17 NOTE — Telephone Encounter (Signed)
Pts questions answered.  

## 2017-05-17 NOTE — Telephone Encounter (Signed)
lmtrc

## 2017-07-25 ENCOUNTER — Other Ambulatory Visit: Payer: Self-pay | Admitting: Obstetrics and Gynecology

## 2017-07-25 NOTE — Telephone Encounter (Signed)
Error

## 2018-03-26 ENCOUNTER — Encounter: Payer: Self-pay | Admitting: Obstetrics and Gynecology

## 2018-03-26 ENCOUNTER — Ambulatory Visit: Payer: BC Managed Care – PPO | Admitting: Obstetrics and Gynecology

## 2018-03-26 VITALS — BP 91/62 | HR 74 | Ht 59.0 in | Wt 98.6 lb

## 2018-03-26 DIAGNOSIS — N644 Mastodynia: Secondary | ICD-10-CM

## 2018-03-26 NOTE — Patient Instructions (Signed)

## 2018-03-26 NOTE — Progress Notes (Signed)
Pt stated that she is having right breast pain and burning sensation in the nipple area x 3 days. No other complaints,

## 2018-03-26 NOTE — Progress Notes (Signed)
    GYNECOLOGY PROGRESS NOTE  Subjective:    Patient ID: Abigail Johnston, female    DOB: 07/09/1985, 32 y.o.   MRN: [id number]  HPI  Patient is a 32 y.o. [id number] female who presents for right breast pain x 1 day and burning x 2 days.  Denies any nipple discharge, lumps in breast. Has had this happen once before, right after her cycle went off. Denies personal history of breast issues. Family history significant for breast cancer in grandmother (passed in late 4330s). Discontinued contraception in May (noted that it had an affect on her libido).  Tried condoms but noted vaginal irritation.   The following portions of the patient's history were reviewed and updated as appropriate: allergies, current medications, past family history, past medical history, past social history, past surgical history and problem list.  Review of Systems Pertinent items noted in HPI and remainder of comprehensive ROS otherwise negative.   Objective:   Blood pressure 91/62, pulse 74, height 4\' 11"  (1.499 m), weight 98 lb 9.6 oz (44.7 kg), last menstrual period 03/17/2018. General appearance: alert and no distress  Breasts: breasts appear normal, no suspicious masses, no skin or nipple changes or axillary nodes.  Assessment:   Mastalgia  Plan:   - Discussion had with patient regarding monitoring her symptoms in relation to her cycle, decreasing caffeine intake if applicable, use of Vitamin E, cold compresses, NSAIDs, etc.  - To f/u in 1 month if symptoms have not improved.   Hildred Laserherry, Ernestine Langworthy, MD [facility]

## 2018-04-23 ENCOUNTER — Encounter: Payer: BC Managed Care – PPO | Admitting: Obstetrics and Gynecology

## 2018-05-14 ENCOUNTER — Encounter: Payer: BC Managed Care – PPO | Admitting: Obstetrics and Gynecology

## 2018-06-10 ENCOUNTER — Encounter: Payer: BC Managed Care – PPO | Admitting: Obstetrics and Gynecology

## 2018-07-28 ENCOUNTER — Encounter: Payer: BC Managed Care – PPO | Admitting: Obstetrics and Gynecology

## 2018-08-09 ENCOUNTER — Other Ambulatory Visit: Payer: Self-pay | Admitting: Obstetrics and Gynecology

## 2018-09-24 ENCOUNTER — Encounter: Payer: BC Managed Care – PPO | Admitting: Obstetrics and Gynecology

## 2018-12-02 ENCOUNTER — Encounter: Payer: BC Managed Care – PPO | Admitting: Obstetrics and Gynecology

## 2019-02-18 ENCOUNTER — Other Ambulatory Visit: Payer: Self-pay

## 2019-02-18 DIAGNOSIS — Z20822 Contact with and (suspected) exposure to covid-19: Secondary | ICD-10-CM

## 2019-02-20 LAB — NOVEL CORONAVIRUS, NAA: SARS-CoV-2, NAA: NOT DETECTED

## 2019-02-25 ENCOUNTER — Encounter: Payer: Self-pay | Admitting: Obstetrics and Gynecology

## 2019-02-25 ENCOUNTER — Other Ambulatory Visit: Payer: Self-pay

## 2019-02-25 ENCOUNTER — Ambulatory Visit: Payer: BC Managed Care – PPO | Admitting: Obstetrics and Gynecology

## 2019-02-25 VITALS — BP 99/67 | HR 85 | Ht 59.5 in | Wt 96.5 lb

## 2019-02-25 DIAGNOSIS — N939 Abnormal uterine and vaginal bleeding, unspecified: Secondary | ICD-10-CM | POA: Diagnosis not present

## 2019-02-25 DIAGNOSIS — Z8669 Personal history of other diseases of the nervous system and sense organs: Secondary | ICD-10-CM | POA: Diagnosis not present

## 2019-02-25 DIAGNOSIS — Z3201 Encounter for pregnancy test, result positive: Secondary | ICD-10-CM | POA: Diagnosis not present

## 2019-02-25 DIAGNOSIS — N926 Irregular menstruation, unspecified: Secondary | ICD-10-CM | POA: Diagnosis not present

## 2019-02-25 LAB — POCT URINE PREGNANCY: Preg Test, Ur: POSITIVE — AB

## 2019-02-25 NOTE — Patient Instructions (Addendum)
Morning Sickness  Morning sickness is when you feel sick to your stomach (nauseous) during pregnancy. You may feel sick to your stomach and throw up (vomit). You may feel sick in the morning, but you can feel this way at any time of day. Some women feel very sick to their stomach and cannot stop throwing up (hyperemesis gravidarum). Follow these instructions at home: Medicines  Take over-the-counter and prescription medicines only as told by your doctor. Do not take any medicines until you talk with your doctor about them first.  Taking multivitamins before getting pregnant can stop or lessen the harshness of morning sickness. Eating and drinking  Eat dry toast or crackers before getting out of bed.  Eat 5 or 6 small meals a day.  Eat dry and bland foods like rice and baked potatoes.  Do not eat greasy, fatty, or spicy foods.  Have someone cook for you if the smell of food causes you to feel sick or throw up.  If you feel sick to your stomach after taking prenatal vitamins, take them at night or with a snack.  Eat protein when you need a snack. Nuts, yogurt, and cheese are good choices.  Drink fluids throughout the day.  Try ginger ale made with real ginger, ginger tea made from fresh grated ginger, or ginger candies. General instructions  Do not use any products that have nicotine or tobacco in them, such as cigarettes and e-cigarettes. If you need help quitting, ask your doctor.  Use an air purifier to keep the air in your house free of smells.  Get lots of fresh air.  Try to avoid smells that make you feel sick.  Try: ? Wearing a bracelet that is used for seasickness (acupressure wristband). ? Going to a doctor who puts thin needles into certain body points (acupuncture) to improve how you feel. Contact a doctor if:  You need medicine to feel better.  You feel dizzy or light-headed.  You are losing weight. Get help right away if:  You feel very sick to your  stomach and cannot stop throwing up.  You pass out (faint).  You have very bad pain in your belly. Summary  Morning sickness is when you feel sick to your stomach (nauseous) during pregnancy.  You may feel sick in the morning, but you can feel this way at any time of day.  Making some changes to what you eat may help your symptoms go away. This information is not intended to replace advice given to you by your health care provider. Make sure you discuss any questions you have with your health care provider. Document Released: 05/31/2004 Document Revised: 04/05/2017 Document Reviewed: 05/24/2016 Elsevier Patient Education  2020 [employer]. How a Baby Grows During Pregnancy  Pregnancy begins when a female's sperm enters a female's egg (fertilization). Fertilization usually happens in one of the tubes (fallopian tubes) that connect the ovaries to the womb (uterus). The fertilized egg moves down the fallopian tube to the uterus. Once it reaches the uterus, it implants into the lining of the uterus and begins to grow. For the first 10 weeks, the fertilized egg is called an embryo. After 10 weeks, it is called a fetus. As the fetus continues to grow, it receives oxygen and nutrients through tissue (placenta) that grows to support the developing baby. The placenta is the life support system for the baby. It provides oxygen and nutrition and removes waste. Learning as much as you can about your pregnancy and  how your baby is developing can help you enjoy the experience. It can also make you aware of when there might be a problem and when to ask questions. How long does a typical pregnancy last? A pregnancy usually lasts 280 days, or about 40 weeks. Pregnancy is divided into three periods of growth, also called trimesters:  First trimester: 0-12 weeks.  Second trimester: 13-27 weeks.  Third trimester: 28-40 weeks. The day when your baby is ready to be born (full term) is your estimated date of  delivery. How does my baby develop month by month? First month  The fertilized egg attaches to the inside of the uterus.  Some cells will form the placenta. Others will form the fetus.  The arms, legs, brain, spinal cord, lungs, and heart begin to develop.  At the end of the first month, the heart begins to beat. Second month  The bones, inner ear, eyelids, hands, and feet form.  The genitals develop.  By the end of 8 weeks, all major organs are developing. Third month  All of the internal organs are forming.  Teeth develop below the gums.  Bones and muscles begin to grow. The spine can flex.  The skin is transparent.  Fingernails and toenails begin to form.  Arms and legs continue to grow longer, and hands and feet develop.  The fetus is about 3 inches (7.6 cm) long. Fourth month  The placenta is completely formed.  The external sex organs, neck, outer ear, eyebrows, eyelids, and fingernails are formed.  The fetus can hear, swallow, and move its arms and legs.  The kidneys begin to produce urine.  The skin is covered with a white, waxy coating (vernix) and very fine hair (lanugo). Fifth month  The fetus moves around more and can be felt for the first time (quickening).  The fetus starts to sleep and wake up and may begin to suck its finger.  The nails grow to the end of the fingers.  The organ in the digestive system that makes bile (gallbladder) functions and helps to digest nutrients.  If your baby is a girl, eggs are present in her ovaries. If your baby is a boy, testicles start to move down into his scrotum. Sixth month  The lungs are formed.  The eyes open. The brain continues to develop.  Your baby has fingerprints and toe prints. Your baby's hair grows thicker.  At the end of the second trimester, the fetus is about 9 inches (22.9 cm) long. Seventh month  The fetus kicks and stretches.  The eyes are developed enough to sense changes in  light.  The hands can make a grasping motion.  The fetus responds to sound. Eighth month  All organs and body systems are fully developed and functioning.  Bones harden, and taste buds develop. The fetus may hiccup.  Certain areas of the brain are still developing. The skull remains soft. Ninth month  The fetus gains about  lb (0.23 kg) each week.  The lungs are fully developed.  Patterns of sleep develop.  The fetus's head typically moves into a head-down position (vertex) in the uterus to prepare for birth.  The fetus weighs 6-9 lb (2.72-4.08 kg) and is 19-20 inches (48.26-50.8 cm) long. What can I do to have a healthy pregnancy and help my baby develop? General instructions  Take prenatal vitamins as directed by your health care provider. These include vitamins such as folic acid, iron, calcium, and vitamin D. They are important  for healthy development.  Take medicines only as directed by your health care provider. Read labels and ask a pharmacist or your health care provider whether over-the-counter medicines, supplements, and prescription drugs are safe to take during pregnancy.  Keep all follow-up visits as directed by your health care provider. This is important. Follow-up visits include prenatal care and screening tests. How do I know if my baby is developing well? At each prenatal visit, your health care provider will do several different tests to check on your health and keep track of your baby's development. These include:  Fundal height and position. ? Your health care provider will measure your growing belly from your pubic bone to the top of the uterus using a tape measure. ? Your health care provider will also feel your belly to determine your baby's position.  Heartbeat. ? An ultrasound in the first trimester can confirm pregnancy and show a heartbeat, depending on how far along you are. ? Your health care provider will check your baby's heart rate at every  prenatal visit.  Second trimester ultrasound. ? This ultrasound checks your baby's development. It also may show your baby's gender. What should I do if I have concerns about my baby's development? Always talk with your health care provider about any concerns that you may have about your pregnancy and your baby. Summary  A pregnancy usually lasts 280 days, or about 40 weeks. Pregnancy is divided into three periods of growth, also called trimesters.  Your health care provider will monitor your baby's growth and development throughout your pregnancy.  Follow your health care provider's recommendations about taking prenatal vitamins and medicines during your pregnancy.  Talk with your health care provider if you have any concerns about your pregnancy or your developing baby. This information is not intended to replace advice given to you by your health care provider. Make sure you discuss any questions you have with your health care provider. Document Released: 10/10/2007 Document Revised: 08/14/2018 Document Reviewed: 03/06/2017 Elsevier Patient Education  2020 [place] [employer] Pregnancy  The first trimester of pregnancy is from week 1 until the end of week 13 (months 1 through 3). During this time, your baby will begin to develop inside you. At 6-8 weeks, the eyes and face are formed, and the heartbeat can be seen on ultrasound. At the end of 12 weeks, all the baby's organs are formed. Prenatal care is all the medical care you receive before the birth of your baby. Make sure you get good prenatal care and follow all of your doctor's instructions. Follow these instructions at home: Medicines  Take over-the-counter and prescription medicines only as told by your doctor. Some medicines are safe and some medicines are not safe during pregnancy.  Take a prenatal vitamin that contains at least 600 micrograms (mcg) of folic acid.  If you have trouble pooping (constipation),  take medicine that will make your stool soft (stool softener) if your doctor approves. Eating and drinking   Eat regular, healthy meals.  Your doctor will tell you the amount of weight gain that is right for you.  Avoid raw meat and uncooked cheese.  If you feel sick to your stomach (nauseous) or throw up (vomit): ? Eat 4 or 5 small meals a day instead of 3 large meals. ? Try eating a few soda crackers. ? Drink liquids between meals instead of during meals.  To prevent constipation: ? Eat foods that are high in fiber, like fresh  fruits and vegetables, whole grains, and beans. ? Drink enough fluids to keep your pee (urine) clear or pale yellow. Activity  Exercise only as told by your doctor. Stop exercising if you have cramps or pain in your lower belly (abdomen) or low back.  Do not exercise if it is too hot, too humid, or if you are in a place of great height (high altitude).  Try to avoid standing for long periods of time. Move your legs often if you must stand in one place for a long time.  Avoid heavy lifting.  Wear low-heeled shoes. Sit and stand up straight.  You can have sex unless your doctor tells you not to. Relieving pain and discomfort  Wear a good support bra if your breasts are sore.  Take warm water baths (sitz baths) to soothe pain or discomfort caused by hemorrhoids. Use hemorrhoid cream if your doctor says it is okay.  Rest with your legs raised if you have leg cramps or low back pain.  If you have puffy, bulging veins (varicose veins) in your legs: ? Wear support hose or compression stockings as told by your doctor. ? Raise (elevate) your feet for 15 minutes, 3-4 times a day. ? Limit salt in your food. Prenatal care  Schedule your prenatal visits by the twelfth week of pregnancy.  Write down your questions. Take them to your prenatal visits.  Keep all your prenatal visits as told by your doctor. This is important. Safety  Wear your seat belt at  all times when driving.  Make a list of emergency phone numbers. The list should include numbers for family, friends, the hospital, and police and fire departments. General instructions  Ask your doctor for a referral to a local prenatal class. Begin classes no later than at the start of month 6 of your pregnancy.  Ask for help if you need counseling or if you need help with nutrition. Your doctor can give you advice or tell you where to go for help.  Do not use hot tubs, steam rooms, or saunas.  Do not douche or use tampons or scented sanitary pads.  Do not cross your legs for long periods of time.  Avoid all herbs and alcohol. Avoid drugs that are not approved by your doctor.  Do not use any tobacco products, including cigarettes, chewing tobacco, and electronic cigarettes. If you need help quitting, ask your doctor. You may get counseling or other support to help you quit.  Avoid cat litter boxes and soil used by cats. These carry germs that can cause birth defects in the baby and can cause a loss of your baby (miscarriage) or stillbirth.  Visit your dentist. At home, brush your teeth with a soft toothbrush. Be gentle when you floss. Contact a doctor if:  You are dizzy.  You have mild cramps or pressure in your lower belly.  You have a nagging pain in your belly area.  You continue to feel sick to your stomach, you throw up, or you have watery poop (diarrhea).  You have a bad smelling fluid coming from your vagina.  You have pain when you pee (urinate).  You have increased puffiness (swelling) in your face, hands, legs, or ankles. Get help right away if:  You have a fever.  You are leaking fluid from your vagina.  You have spotting or bleeding from your vagina.  You have very bad belly cramping or pain.  You gain or lose weight rapidly.  You throw  up blood. It may look like coffee grounds.  You are around people who have [place] measles, fifth disease, or  chickenpox.  You have a very bad headache.  You have shortness of breath.  You have any kind of trauma, such as from a fall or a car accident. Summary  The first trimester of pregnancy is from week 1 until the end of week 13 (months 1 through 3).  To take care of yourself and your unborn baby, you will need to eat healthy meals, take medicines only if your doctor tells you to do so, and do activities that are safe for you and your baby.  Keep all follow-up visits as told by your doctor. This is important as your doctor will have to ensure that your baby is healthy and growing well. This information is not intended to replace advice given to you by your health care provider. Make sure you discuss any questions you have with your health care provider. Document Released: 10/10/2007 Document Revised: 08/14/2018 Document Reviewed: 05/01/2016 Elsevier Patient Education  [place]. Common Medications Safe in Pregnancy  Acne:      Constipation:  Benzoyl Peroxide     Colace  Clindamycin      Dulcolax Suppository  Topica Erythromycin     Fibercon  Salicylic Acid      Metamucil         Miralax AVOID:        Senakot   Accutane    Cough:  Retin-A       Cough Drops  Tetracycline      Phenergan w/ Codeine if Rx  Minocycline      Robitussin (Plain & DM)  Antibiotics:     Crabs/Lice:  Ceclor       RID  Cephalosporins    AVOID:  E-Mycins      Kwell  Keflex  Macrobid/Macrodantin   Diarrhea:  Penicillin      Kao-Pectate  Zithromax      Imodium AD         PUSH FLUIDS AVOID:       Cipro     Fever:  Tetracycline      Tylenol (Regular or Extra  Minocycline       Strength)  Levaquin      Extra Strength-Do not          Exceed 8 tabs/24 hrs Caffeine:        <237m/day (equiv. To 1 cup of coffee or  approx. 3 12 oz sodas)         Gas: Cold/Hayfever:       Gas-X  Benadryl      Mylicon  Claritin       Phazyme  **Claritin-D        Chlor-Trimeton    Headaches:  Dimetapp      ASA-Free  Excedrin  Drixoral-Non-Drowsy     Cold Compress  Mucinex (Guaifenasin)     Tylenol (Regular or Extra  Sudafed/Sudafed-12 Hour     Strength)  **Sudafed PE Pseudoephedrine   Tylenol Cold & Sinus     Vicks Vapor Rub  Zyrtec  **AVOID if Problems With Blood Pressure         Heartburn: Avoid lying down for at least 1 hour after meals  Aciphex      Maalox     Rash:  Milk of Magnesia     Benadryl    Mylanta       1% Hydrocortisone Cream  Pepcid  Pepcid  Complete   Sleep Aids:  Prevacid      Ambien   Prilosec       Benadryl  Rolaids       Chamomile Tea  Tums (Limit 4/day)     Unisom  Zantac       Tylenol PM         Warm milk-add vanilla or  Hemorrhoids:       Sugar for taste  Anusol/Anusol H.C.  (RX: Analapram 2.5%)  Sugar Substitutes:  Hydrocortisone OTC     Ok in moderation  Preparation H      Tucks        Vaseline lotion applied to tissue with wiping    Herpes:     Throat:  Acyclovir      Oragel  Famvir  Valtrex     Vaccines:         Flu Shot Leg Cramps:       *Gardasil  Benadryl      Hepatitis A         Hepatitis B Nasal Spray:       Pneumovax  Saline Nasal Spray     Polio Booster         Tetanus Nausea:       Tuberculosis test or PPD  Vitamin B6 25 mg TID   AVOID:    Dramamine      *Gardasil  Emetrol       Live Poliovirus  Ginger Root 250 mg QID    MMR (measles, mumps &  High Complex Carbs @ Bedtime    rebella)  Sea Bands-Accupressure    Varicella (Chickenpox)  Unisom 1/2 tab TID     *No known complications           If received before Pain:         Known pregnancy;   Darvocet       Resume series after  Lortab        Delivery  Percocet    Yeast:   Tramadol      Femstat  Tylenol 3      Gyne-lotrimin  Ultram       Monistat  Vicodin           MISC:         All Sunscreens           Hair Coloring/highlights          Insect Repellant's          (Including DEET)         Mystic Tans      How a Baby Grows During Pregnancy  Pregnancy begins when a  female's sperm enters a female's egg (fertilization). Fertilization usually happens in one of the tubes (fallopian tubes) that connect the ovaries to the womb (uterus). The fertilized egg moves down the fallopian tube to the uterus. Once it reaches the uterus, it implants into the lining of the uterus and begins to grow. For the first 10 weeks, the fertilized egg is called an embryo. After 10 weeks, it is called a fetus. As the fetus continues to grow, it receives oxygen and nutrients through tissue (placenta) that grows to support the developing baby. The placenta is the life support system for the baby. It provides oxygen and nutrition and removes waste. Learning as much as you can about your pregnancy and how your baby is developing can help you enjoy the experience. It can also make you aware of when there might be  a problem and when to ask questions. How long does a typical pregnancy last? A pregnancy usually lasts 280 days, or about 40 weeks. Pregnancy is divided into three periods of growth, also called trimesters:  First trimester: 0-12 weeks.  Second trimester: 13-27 weeks.  Third trimester: 28-40 weeks. The day when your baby is ready to be born (full term) is your estimated date of delivery. How does my baby develop month by month? First month  The fertilized egg attaches to the inside of the uterus.  Some cells will form the placenta. Others will form the fetus.  The arms, legs, brain, spinal cord, lungs, and heart begin to develop.  At the end of the first month, the heart begins to beat. Second month  The bones, inner ear, eyelids, hands, and feet form.  The genitals develop.  By the end of 8 weeks, all major organs are developing. Third month  All of the internal organs are forming.  Teeth develop below the gums.  Bones and muscles begin to grow. The spine can flex.  The skin is transparent.  Fingernails and toenails begin to form.  Arms and legs continue to grow  longer, and hands and feet develop.  The fetus is about 3 inches (7.6 cm) long. Fourth month  The placenta is completely formed.  The external sex organs, neck, outer ear, eyebrows, eyelids, and fingernails are formed.  The fetus can hear, swallow, and move its arms and legs.  The kidneys begin to produce urine.  The skin is covered with a white, waxy coating (vernix) and very fine hair (lanugo). Fifth month  The fetus moves around more and can be felt for the first time (quickening).  The fetus starts to sleep and wake up and may begin to suck its finger.  The nails grow to the end of the fingers.  The organ in the digestive system that makes bile (gallbladder) functions and helps to digest nutrients.  If your baby is a girl, eggs are present in her ovaries. If your baby is a boy, testicles start to move down into his scrotum. Sixth month  The lungs are formed.  The eyes open. The brain continues to develop.  Your baby has fingerprints and toe prints. Your baby's hair grows thicker.  At the end of the second trimester, the fetus is about 9 inches (22.9 cm) long. Seventh month  The fetus kicks and stretches.  The eyes are developed enough to sense changes in light.  The hands can make a grasping motion.  The fetus responds to sound. Eighth month  All organs and body systems are fully developed and functioning.  Bones harden, and taste buds develop. The fetus may hiccup.  Certain areas of the brain are still developing. The skull remains soft. Ninth month  The fetus gains about  lb (0.23 kg) each week.  The lungs are fully developed.  Patterns of sleep develop.  The fetus's head typically moves into a head-down position (vertex) in the uterus to prepare for birth.  The fetus weighs 6-9 lb (2.72-4.08 kg) and is 19-20 inches (48.26-50.8 cm) long. What can I do to have a healthy pregnancy and help my baby develop? General instructions  Take prenatal  vitamins as directed by your health care provider. These include vitamins such as folic acid, iron, calcium, and vitamin D. They are important for healthy development.  Take medicines only as directed by your health care provider. Read labels and ask a pharmacist or your  health care provider whether over-the-counter medicines, supplements, and prescription drugs are safe to take during pregnancy.  Keep all follow-up visits as directed by your health care provider. This is important. Follow-up visits include prenatal care and screening tests. How do I know if my baby is developing well? At each prenatal visit, your health care provider will do several different tests to check on your health and keep track of your baby's development. These include:  Fundal height and position. ? Your health care provider will measure your growing belly from your pubic bone to the top of the uterus using a tape measure. ? Your health care provider will also feel your belly to determine your baby's position.  Heartbeat. ? An ultrasound in the first trimester can confirm pregnancy and show a heartbeat, depending on how far along you are. ? Your health care provider will check your baby's heart rate at every prenatal visit.  Second trimester ultrasound. ? This ultrasound checks your baby's development. It also may show your baby's gender. What should I do if I have concerns about my baby's development? Always talk with your health care provider about any concerns that you may have about your pregnancy and your baby. Summary  A pregnancy usually lasts 280 days, or about 40 weeks. Pregnancy is divided into three periods of growth, also called trimesters.  Your health care provider will monitor your baby's growth and development throughout your pregnancy.  Follow your health care provider's recommendations about taking prenatal vitamins and medicines during your pregnancy.  Talk with your health care provider if  you have any concerns about your pregnancy or your developing baby. This information is not intended to replace advice given to you by your health care provider. Make sure you discuss any questions you have with your health care provider. Document Released: 10/10/2007 Document Revised: 08/14/2018 Document Reviewed: 03/06/2017 Elsevier Patient Education  2020 [employer].

## 2019-02-25 NOTE — Progress Notes (Signed)
    GYNECOLOGY CLINIC PROGRESS NOTE Subjective:    Abigail Johnston is a 33 y.o. .[id number] female who presents for evaluation of amenorrhea. She believes she could be pregnant. Pregnancy is desired. Sexual Activity: single partner, contraception: none. Current symptoms also include: occasonal spotting/cramping and mild nausea managed with modifying diets. Last period was normal.   Patient's last menstrual period was 12/26/2018.     The following portions of the patient's history were reviewed and updated as appropriate:   She  has a past medical history of Headache and Migraine headache with aura.   She  has a past surgical history that includes Wisdom tooth extraction.   Her family history includes Breast cancer in her maternal grandmother; Cancer in her maternal uncle; Healthy in her father and mother.   She  reports that she has never smoked. She has never used smokeless tobacco. She reports that she does not drink alcohol or use drugs.   Current Outpatient Medications on File Prior to Visit  Medication Sig Dispense Refill  . Multiple Vitamins-Minerals (HAIR SKIN AND NAILS FORMULA PO) Take by mouth.    . Prenatal Vit-Fe Fumarate-FA (PRENATAL MULTIVITAMIN) TABS tablet Take 1 tablet by mouth daily at 12 noon.     No current facility-administered medications on file prior to visit.    She has No Known Allergies..  Review of Systems Pertinent items noted in HPI and remainder of comprehensive ROS otherwise negative.     Objective:    BP 99/67   Pulse 85   Ht 4' 11.5" (1.511 m)   Wt 96 lb 8 oz (43.8 kg)   LMP 12/26/2018   BMI 19.16 kg/m  General: alert, no distress and no acute distress    Lab Review Urine HCG: positive    Assessment:    Absence of menstruation.    Vaginal spotting Pregnancy test positive History of migraines  Plan:   1. Pregnancy Test: Positive: EDC: 10/02/2019, with EGA 8.5 weeks by dates. Briefly discussed pre-natal care options.  Encouraged  well-balanced diet, plenty of rest when needed, pre-natal vitamins daily and walking for exercise. Discussed self-help for nausea, avoiding OTC medications until consulting provider or pharmacist, other than Tylenol as needed, minimal caffeine (1-2 cups daily) and avoiding alcohol. She will schedule her initial OB visit in the next month. Will schedule for ultrasound for dating/viability next week. Feel free to call with any questions.  2. Patient with h/o migraines, can take Tylenol as needed or can prescribe medications if symptoms worsen.    Rubie Maid, MD [facility]

## 2019-02-25 NOTE — Progress Notes (Signed)
PT is present today for confirmation of pregnancy. Pt LMP 12/26/18. UPT done today results were positive. Pt stated that she is doing well no complaints.

## 2019-03-10 ENCOUNTER — Other Ambulatory Visit: Payer: Self-pay

## 2019-03-10 ENCOUNTER — Ambulatory Visit (INDEPENDENT_AMBULATORY_CARE_PROVIDER_SITE_OTHER): Payer: BC Managed Care – PPO

## 2019-03-10 DIAGNOSIS — Z3A09 9 weeks gestation of pregnancy: Secondary | ICD-10-CM | POA: Diagnosis not present

## 2019-03-10 DIAGNOSIS — N939 Abnormal uterine and vaginal bleeding, unspecified: Secondary | ICD-10-CM

## 2019-03-10 DIAGNOSIS — O3481 Maternal care for other abnormalities of pelvic organs, first trimester: Secondary | ICD-10-CM | POA: Diagnosis not present

## 2019-03-10 DIAGNOSIS — N8311 Corpus luteum cyst of right ovary: Secondary | ICD-10-CM

## 2019-03-10 DIAGNOSIS — Z3201 Encounter for pregnancy test, result positive: Secondary | ICD-10-CM

## 2019-03-10 DIAGNOSIS — N926 Irregular menstruation, unspecified: Secondary | ICD-10-CM

## 2019-03-11 ENCOUNTER — Encounter: Payer: Self-pay | Admitting: Obstetrics and Gynecology

## 2019-03-13 ENCOUNTER — Ambulatory Visit (INDEPENDENT_AMBULATORY_CARE_PROVIDER_SITE_OTHER): Payer: BC Managed Care – PPO | Admitting: Obstetrics and Gynecology

## 2019-03-13 ENCOUNTER — Other Ambulatory Visit: Payer: Self-pay

## 2019-03-13 VITALS — BP 97/64 | HR 85 | Ht 59.5 in | Wt 98.2 lb

## 2019-03-13 DIAGNOSIS — Z3481 Encounter for supervision of other normal pregnancy, first trimester: Secondary | ICD-10-CM

## 2019-03-13 DIAGNOSIS — Z0283 Encounter for blood-alcohol and blood-drug test: Secondary | ICD-10-CM

## 2019-03-13 DIAGNOSIS — Z113 Encounter for screening for infections with a predominantly sexual mode of transmission: Secondary | ICD-10-CM

## 2019-03-13 NOTE — Patient Instructions (Signed)
WHAT OB PATIENTS CAN EXPECT   Confirmation of pregnancy and ultrasound ordered if medically indicated-[redacted] weeks gestation  New OB (NOB) intake with nurse and New OB (NOB) labs- [redacted] weeks gestation  New OB (NOB) physical examination with provider- 11/[redacted] weeks gestation  Flu vaccine-[redacted] weeks gestation  Anatomy scan-[redacted] weeks gestation  Glucose tolerance test, blood work to test for anemia, T-dap vaccine-[redacted] weeks gestation  Vaginal swabs/cultures-STD/Group B strep-[redacted] weeks gestation  Appointments every 4 weeks until 28 weeks  Every 2 weeks from 28 weeks until 36 weeks  Weekly visits from 36 weeks until delivery  How a Baby Grows During Pregnancy  Pregnancy begins when a female's sperm enters a female's egg (fertilization). Fertilization usually happens in one of the tubes (fallopian tubes) that connect the ovaries to the womb (uterus). The fertilized egg moves down the fallopian tube to the uterus. Once it reaches the uterus, it implants into the lining of the uterus and begins to grow. For the first 10 weeks, the fertilized egg is called an embryo. After 10 weeks, it is called a fetus. As the fetus continues to grow, it receives oxygen and nutrients through tissue (placenta) that grows to support the developing baby. The placenta is the life support system for the baby. It provides oxygen and nutrition and removes waste. Learning as much as you can about your pregnancy and how your baby is developing can help you enjoy the experience. It can also make you aware of when there might be a problem and when to ask questions. How long does a typical pregnancy last? A pregnancy usually lasts 280 days, or about 40 weeks. Pregnancy is divided into three periods of growth, also called trimesters:  First trimester: 0-12 weeks.  Second trimester: 13-27 weeks.  Third trimester: 28-40 weeks. The day when your baby is ready to be born (full term) is your estimated date of delivery. How does my baby  develop month by month? First month  The fertilized egg attaches to the inside of the uterus.  Some cells will form the placenta. Others will form the fetus.  The arms, legs, brain, spinal cord, lungs, and heart begin to develop.  At the end of the first month, the heart begins to beat. Second month  The bones, inner ear, eyelids, hands, and feet form.  The genitals develop.  By the end of 8 weeks, all major organs are developing. Third month  All of the internal organs are forming.  Teeth develop below the gums.  Bones and muscles begin to grow. The spine can flex.  The skin is transparent.  Fingernails and toenails begin to form.  Arms and legs continue to grow longer, and hands and feet develop.  The fetus is about 3 inches (7.6 cm) long. Fourth month  The placenta is completely formed.  The external sex organs, neck, outer ear, eyebrows, eyelids, and fingernails are formed.  The fetus can hear, swallow, and move its arms and legs.  The kidneys begin to produce urine.  The skin is covered with a white, waxy coating (vernix) and very fine hair (lanugo). Fifth month  The fetus moves around more and can be felt for the first time (quickening).  The fetus starts to sleep and wake up and may begin to suck its finger.  The nails grow to the end of the fingers.  The organ in the digestive system that makes bile (gallbladder) functions and helps to digest nutrients.  If your baby is a girl, eggs  are present in her ovaries. If your baby is a boy, testicles start to move down into his scrotum. Sixth month  The lungs are formed.  The eyes open. The brain continues to develop.  Your baby has fingerprints and toe prints. Your baby's hair grows thicker.  At the end of the second trimester, the fetus is about 9 inches (22.9 cm) long. Seventh month  The fetus kicks and stretches.  The eyes are developed enough to sense changes in light.  The hands can make a  grasping motion.  The fetus responds to sound. Eighth month  All organs and body systems are fully developed and functioning.  Bones harden, and taste buds develop. The fetus may hiccup.  Certain areas of the brain are still developing. The skull remains soft. Ninth month  The fetus gains about  lb (0.23 kg) each week.  The lungs are fully developed.  Patterns of sleep develop.  The fetus's head typically moves into a head-down position (vertex) in the uterus to prepare for birth.  The fetus weighs 6-9 lb (2.72-4.08 kg) and is 19-20 inches (48.26-50.8 cm) long. What can I do to have a healthy pregnancy and help my baby develop? General instructions  Take prenatal vitamins as directed by your health care provider. These include vitamins such as folic acid, iron, calcium, and vitamin D. They are important for healthy development.  Take medicines only as directed by your health care provider. Read labels and ask a pharmacist or your health care provider whether over-the-counter medicines, supplements, and prescription drugs are safe to take during pregnancy.  Keep all follow-up visits as directed by your health care provider. This is important. Follow-up visits include prenatal care and screening tests. How do I know if my baby is developing well? At each prenatal visit, your health care provider will do several different tests to check on your health and keep track of your baby's development. These include:  Fundal height and position. ? Your health care provider will measure your growing belly from your pubic bone to the top of the uterus using a tape measure. ? Your health care provider will also feel your belly to determine your baby's position.  Heartbeat. ? An ultrasound in the first trimester can confirm pregnancy and show a heartbeat, depending on how far along you are. ? Your health care provider will check your baby's heart rate at every prenatal visit.  Second  trimester ultrasound. ? This ultrasound checks your baby's development. It also may show your baby's gender. What should I do if I have concerns about my baby's development? Always talk with your health care provider about any concerns that you may have about your pregnancy and your baby. Summary  A pregnancy usually lasts 280 days, or about 40 weeks. Pregnancy is divided into three periods of growth, also called trimesters.  Your health care provider will monitor your baby's growth and development throughout your pregnancy.  Follow your health care provider's recommendations about taking prenatal vitamins and medicines during your pregnancy.  Talk with your health care provider if you have any concerns about your pregnancy or your developing baby. This information is not intended to replace advice given to you by your health care provider. Make sure you discuss any questions you have with your health care provider. Document Released: 10/10/2007 Document Revised: 08/14/2018 Document Reviewed: 03/06/2017 Elsevier Patient Education  2020 [place] of Pregnancy  The first trimester of pregnancy is from week 1 until the  end of week 13 (months 1 through 3). During this time, your baby will begin to develop inside you. At 6-8 weeks, the eyes and face are formed, and the heartbeat can be seen on ultrasound. At the end of 12 weeks, all the baby's organs are formed. Prenatal care is all the medical care you receive before the birth of your baby. Make sure you get good prenatal care and follow all of your doctor's instructions. Follow these instructions at home: Medicines  Take over-the-counter and prescription medicines only as told by your doctor. Some medicines are safe and some medicines are not safe during pregnancy.  Take a prenatal vitamin that contains at least 600 micrograms (mcg) of folic acid.  If you have trouble pooping (constipation), take medicine that will make  your stool soft (stool softener) if your doctor approves. Eating and drinking   Eat regular, healthy meals.  Your doctor will tell you the amount of weight gain that is right for you.  Avoid raw meat and uncooked cheese.  If you feel sick to your stomach (nauseous) or throw up (vomit): ? Eat 4 or 5 small meals a day instead of 3 large meals. ? Try eating a few soda crackers. ? Drink liquids between meals instead of during meals.  To prevent constipation: ? Eat foods that are high in fiber, like fresh fruits and vegetables, whole grains, and beans. ? Drink enough fluids to keep your pee (urine) clear or pale yellow. Activity  Exercise only as told by your doctor. Stop exercising if you have cramps or pain in your lower belly (abdomen) or low back.  Do not exercise if it is too hot, too humid, or if you are in a place of great height (high altitude).  Try to avoid standing for long periods of time. Move your legs often if you must stand in one place for a long time.  Avoid heavy lifting.  Wear low-heeled shoes. Sit and stand up straight.  You can have sex unless your doctor tells you not to. Relieving pain and discomfort  Wear a good support bra if your breasts are sore.  Take warm water baths (sitz baths) to soothe pain or discomfort caused by hemorrhoids. Use hemorrhoid cream if your doctor says it is okay.  Rest with your legs raised if you have leg cramps or low back pain.  If you have puffy, bulging veins (varicose veins) in your legs: ? Wear support hose or compression stockings as told by your doctor. ? Raise (elevate) your feet for 15 minutes, 3-4 times a day. ? Limit salt in your food. Prenatal care  Schedule your prenatal visits by the twelfth week of pregnancy.  Write down your questions. Take them to your prenatal visits.  Keep all your prenatal visits as told by your doctor. This is important. Safety  Wear your seat belt at all times when  driving.  Make a list of emergency phone numbers. The list should include numbers for family, friends, the hospital, and police and fire departments. General instructions  Ask your doctor for a referral to a local prenatal class. Begin classes no later than at the start of month 6 of your pregnancy.  Ask for help if you need counseling or if you need help with nutrition. Your doctor can give you advice or tell you where to go for help.  Do not use hot tubs, steam rooms, or saunas.  Do not douche or use tampons or scented sanitary pads.  Do not cross your legs for long periods of time.  Avoid all herbs and alcohol. Avoid drugs that are not approved by your doctor.  Do not use any tobacco products, including cigarettes, chewing tobacco, and electronic cigarettes. If you need help quitting, ask your doctor. You may get counseling or other support to help you quit.  Avoid cat litter boxes and soil used by cats. These carry germs that can cause birth defects in the baby and can cause a loss of your baby (miscarriage) or stillbirth.  Visit your dentist. At home, brush your teeth with a soft toothbrush. Be gentle when you floss. Contact a doctor if:  You are dizzy.  You have mild cramps or pressure in your lower belly.  You have a nagging pain in your belly area.  You continue to feel sick to your stomach, you throw up, or you have watery poop (diarrhea).  You have a bad smelling fluid coming from your vagina.  You have pain when you pee (urinate).  You have increased puffiness (swelling) in your face, hands, legs, or ankles. Get help right away if:  You have a fever.  You are leaking fluid from your vagina.  You have spotting or bleeding from your vagina.  You have very bad belly cramping or pain.  You gain or lose weight rapidly.  You throw up blood. It may look like coffee grounds.  You are around people who have [place] measles, fifth disease, or chickenpox.  You have  a very bad headache.  You have shortness of breath.  You have any kind of trauma, such as from a fall or a car accident. Summary  The first trimester of pregnancy is from week 1 until the end of week 13 (months 1 through 3).  To take care of yourself and your unborn baby, you will need to eat healthy meals, take medicines only if your doctor tells you to do so, and do activities that are safe for you and your baby.  Keep all follow-up visits as told by your doctor. This is important as your doctor will have to ensure that your baby is healthy and growing well. This information is not intended to replace advice given to you by your health care provider. Make sure you discuss any questions you have with your health care provider. Document Released: 10/10/2007 Document Revised: 08/14/2018 Document Reviewed: 05/01/2016 Elsevier Patient Education  2020 [employer]. Commonly Asked Questions During Pregnancy  Cats: A parasite can be excreted in cat feces.  To avoid exposure you need to have another person empty the little box.  If you must empty the litter box you will need to wear gloves.  Wash your hands after handling your cat.  This parasite can also be found in raw or undercooked meat so this should also be avoided.  Colds, Sore Throats, Flu: Please check your medication sheet to see what you can take for symptoms.  If your symptoms are unrelieved by these medications please call the office.  Dental Work: Most any dental work Investment banker, corporate recommends is permitted.  X-rays should only be taken during the first trimester if absolutely necessary.  Your abdomen should be shielded with a lead apron during all x-rays.  Please notify your provider prior to receiving any x-rays.  Novocaine is fine; gas is not recommended.  If your dentist requires a note from [place] prior to dental work please call the office and we will provide one for you.  Exercise: Exercise  is an important part of staying healthy  during your pregnancy.  You may continue most exercises you were accustomed to prior to pregnancy.  Later in your pregnancy you will most likely notice you have difficulty with activities requiring balance like riding a bicycle.  It is important that you listen to your body and avoid activities that put you at a higher risk of falling.  Adequate rest and staying well hydrated are a must!  If you have questions about the safety of specific activities ask your provider.    Exposure to Children with illness: Try to avoid obvious exposure; report any symptoms to [place] when noted,  If you have chicken pos, red measles or mumps, you should be immune to these diseases.   Please do not take any vaccines while pregnant unless you have checked with your OB provider.  Fetal Movement: After 28 weeks we recommend you do "kick counts" twice daily.  Lie or sit down in a calm quiet environment and count your baby movements "kicks".  You should feel your baby at least 10 times per hour.  If you have not felt 10 kicks within the first hour get up, walk around and have something sweet to eat or drink then repeat for an additional hour.  If count remains less than 10 per hour notify your provider.  Fumigating: Follow your pest control agent's advice as to how long to stay out of your home.  Ventilate the area well before re-entering.  Hemorrhoids:   Most over-the-counter preparations can be used during pregnancy.  Check your medication to see what is safe to use.  It is important to use a stool softener or fiber in your diet and to drink lots of liquids.  If hemorrhoids seem to be getting worse please call the office.   Hot Tubs:  Hot tubs Jacuzzis and saunas are not recommended while pregnant.  These increase your internal body temperature and should be avoided.  Intercourse:  Sexual intercourse is safe during pregnancy as long as you are comfortable, unless otherwise advised by your provider.  Spotting may occur after  intercourse; report any bright red bleeding that is heavier than spotting.  Labor:  If you know that you are in labor, please go to the hospital.  If you are unsure, please call the office and let us help you decide what to do.  Lifting, straining, etc:  If your job requires heavy lifting or straining please check with your provider for any limitations.  Generally, you should not lift items heavier than that you can lift simply with your hands and arms (no back muscles)  Painting:  Paint fumes do not harm your pregnancy, but may make you ill and should be avoided if possible.  Latex or water based paints have less odor than oils.  Use adequate ventilation while painting.  Permanents & Hair Color:  Chemicals in hair dyes are not recommended as they cause increase hair dryness which can increase hair loss during pregnancy.  " Highlighting" and permanents are allowed.  Dye may be absorbed differently and permanents may not hold as well during pregnancy.  Sunbathing:  Use a sunscreen, as skin burns easily during pregnancy.  Drink plenty of fluids; avoid over heating.  Tanning Beds:  Because their possible side effects are still unknown, tanning beds are not recommended.  Ultrasound Scans:  Routine ultrasounds are performed at approximately 20 weeks.  You will be able to see your baby's general anatomy an if you  would like to know the gender this can usually be determined as well.  If it is questionable when you conceived you may also receive an ultrasound early in your pregnancy for dating purposes.  Otherwise ultrasound exams are not routinely performed unless there is a medical necessity.  Although you can request a scan we ask that you pay for it when conducted because insurance does not cover " patient request" scans.  Work: If your pregnancy proceeds without complications you may work until your due date, unless your physician or employer advises otherwise.  Round Ligament Pain/Pelvic Discomfort:   Sharp, shooting pains not associated with bleeding are fairly common, usually occurring in the second trimester of pregnancy.  They tend to be worse when standing up or when you remain standing for long periods of time.  These are the result of pressure of certain pelvic ligaments called "round ligaments".  Rest, Tylenol and heat seem to be the most effective relief.  As the womb and fetus grow, they rise out of the pelvis and the discomfort improves.  Please notify the office if your pain seems different than that described.  It may represent a more serious condition.  Common Medications Safe in Pregnancy  Acne:      Constipation:  Benzoyl Peroxide     Colace  Clindamycin      Dulcolax Suppository  Topica Erythromycin     Fibercon  Salicylic Acid      Metamucil         Miralax AVOID:        Senakot   Accutane    Cough:  Retin-A       Cough Drops  Tetracycline      Phenergan w/ Codeine if Rx  Minocycline      Robitussin (Plain & DM)  Antibiotics:     Crabs/Lice:  Ceclor       RID  Cephalosporins    AVOID:  E-Mycins      Kwell  Keflex  Macrobid/Macrodantin   Diarrhea:  Penicillin      Kao-Pectate  Zithromax      Imodium AD         PUSH FLUIDS AVOID:       Cipro     Fever:  Tetracycline      Tylenol (Regular or Extra  Minocycline       Strength)  Levaquin      Extra Strength-Do not          Exceed 8 tabs/24 hrs Caffeine:        <250m/day (equiv. To 1 cup of coffee or  approx. 3 12 oz sodas)         Gas: Cold/Hayfever:       Gas-X  Benadryl      Mylicon  Claritin       Phazyme  **Claritin-D        Chlor-Trimeton    Headaches:  Dimetapp      ASA-Free Excedrin  Drixoral-Non-Drowsy     Cold Compress  Mucinex (Guaifenasin)     Tylenol (Regular or Extra  Sudafed/Sudafed-12 Hour     Strength)  **Sudafed PE Pseudoephedrine   Tylenol Cold & Sinus     Vicks Vapor Rub  Zyrtec  **AVOID if Problems With Blood Pressure         Heartburn: Avoid lying down for at least 1 hour  after meals  Aciphex      Maalox     Rash:  Milk of Magnesia  Benadryl    Mylanta       1% Hydrocortisone Cream  Pepcid  Pepcid Complete   Sleep Aids:  Prevacid      Ambien   Prilosec       Benadryl  Rolaids       Chamomile Tea  Tums (Limit 4/day)     Unisom  Zantac       Tylenol PM         Warm milk-add vanilla or  Hemorrhoids:       Sugar for taste  Anusol/Anusol H.C.  (RX: Analapram 2.5%)  Sugar Substitutes:  Hydrocortisone OTC     Ok in moderation  Preparation H      Tucks        Vaseline lotion applied to tissue with wiping    Herpes:     Throat:  Acyclovir      Oragel  Famvir  Valtrex     Vaccines:         Flu Shot Leg Cramps:       *Gardasil  Benadryl      Hepatitis A         Hepatitis B Nasal Spray:       Pneumovax  Saline Nasal Spray     Polio Booster         Tetanus Nausea:       Tuberculosis test or PPD  Vitamin B6 25 mg TID   AVOID:    Dramamine      *Gardasil  Emetrol       Live Poliovirus  Ginger Root 250 mg QID    MMR (measles, mumps &  High Complex Carbs @ Bedtime    rebella)  Sea Bands-Accupressure    Varicella (Chickenpox)  Unisom 1/2 tab TID     *No known complications           If received before Pain:         Known pregnancy;   Darvocet       Resume series after  Lortab        Delivery  Percocet    Yeast:   Tramadol      Femstat  Tylenol 3      Gyne-lotrimin  Ultram       Monistat  Vicodin           MISC:         All Sunscreens           Hair Coloring/highlights          Insect Repellant's          (Including DEET)         Mystic Tans

## 2019-03-13 NOTE — Progress Notes (Signed)
      Estela L Welling presents for NOB nurse intake visit. Pregnancy confirmation done at [facility],02/25/19, with Anika [name],MD.  G2.  P0010.  LMP: 12/26/18.  EDD: 10/09/2019.  GA [redacted]w[redacted]d. Pregnancy education material explained and given.  0 cats in the home.  NOB labs ordered. BMI less than 30. TSH/HbgA1c not ordered. Sickle cell ordered due to race. HIV and drug screen explained and ordered. Genetic screening discussed. Genetic testing; Ordered; Natera completed today. Pt to discuss genetic testing with provider. PNV encouraged. Pt to follow up with provider in 2 weeks for NOB physical.    BP 97/64   Pulse 85   Ht 4' 11.5" (1.511 m)   Wt 98 lb 3.2 oz (44.5 kg)   LMP 12/26/2018   BMI 19.50 kg/m

## 2019-03-14 LAB — URINALYSIS, ROUTINE W REFLEX MICROSCOPIC
Bilirubin, UA: NEGATIVE
Glucose, UA: NEGATIVE
Ketones, UA: NEGATIVE
Leukocytes,UA: NEGATIVE
Nitrite, UA: NEGATIVE
Protein,UA: NEGATIVE
RBC, UA: NEGATIVE
Specific Gravity, UA: 1.027 (ref 1.005–1.030)
Urobilinogen, Ur: 0.2 mg/dL (ref 0.2–1.0)
pH, UA: 6 (ref 5.0–7.5)

## 2019-03-14 LAB — HIV ANTIBODY (ROUTINE TESTING W REFLEX): HIV Screen 4th Generation wRfx: NONREACTIVE

## 2019-03-14 LAB — ANTIBODY SCREEN: Antibody Screen: NEGATIVE

## 2019-03-14 LAB — ABO AND RH: Rh Factor: POSITIVE

## 2019-03-14 LAB — RUBELLA SCREEN: Rubella Antibodies, IGG: 6.15 index (ref >0.99)

## 2019-03-14 LAB — VARICELLA ZOSTER ANTIBODY, IGG: Varicella zoster IgG: 2239 index (ref >165)

## 2019-03-14 LAB — HEPATITIS B SURFACE ANTIGEN: Hepatitis B Surface Ag: NEGATIVE

## 2019-03-14 LAB — TOXOPLASMA ANTIBODIES- IGG AND  IGM
Toxoplasma Antibody- IgM: <3 AU/mL (ref 0.0–7.9)
Toxoplasma IgG Ratio: <3 IU/mL (ref 0.0–7.1)

## 2019-03-14 LAB — RPR: RPR Ser Ql: NONREACTIVE

## 2019-03-14 LAB — HGB SOLU + RFLX FRAC: Sickle Solubility Test - HGBRFX: NEGATIVE

## 2019-03-15 LAB — DRUG PROFILE, UR, 9 DRUGS (LABCORP)
Amphetamines, Urine: NEGATIVE ng/mL
Barbiturate Quant, Ur: NEGATIVE ng/mL
Benzodiazepine Quant, Ur: NEGATIVE ng/mL
Cannabinoid Quant, Ur: NEGATIVE ng/mL
Cocaine (Metab.): NEGATIVE ng/mL
Methadone Screen, Urine: NEGATIVE ng/mL
Opiate Quant, Ur: NEGATIVE ng/mL
PCP Quant, Ur: NEGATIVE ng/mL
Propoxyphene: NEGATIVE ng/mL

## 2019-03-15 LAB — NICOTINE SCREEN, URINE: Cotinine Ql Scrn, Ur: NEGATIVE ng/mL

## 2019-03-16 LAB — CULTURE, OB URINE

## 2019-03-16 LAB — URINE CULTURE, OB REFLEX

## 2019-03-18 LAB — GC/CHLAMYDIA PROBE AMP
Chlamydia trachomatis, NAA: NEGATIVE
Neisseria Gonorrhoeae by PCR: NEGATIVE

## 2019-03-30 ENCOUNTER — Encounter: Payer: BC Managed Care – PPO | Admitting: Certified Nurse Midwife

## 2019-04-06 ENCOUNTER — Telehealth: Payer: Self-pay

## 2019-04-06 NOTE — Telephone Encounter (Signed)
Pt called and aware of natera results. Pt desires to not know the sex of the baby at this time.

## 2019-04-09 ENCOUNTER — Other Ambulatory Visit: Payer: Self-pay

## 2019-04-09 ENCOUNTER — Ambulatory Visit (INDEPENDENT_AMBULATORY_CARE_PROVIDER_SITE_OTHER): Payer: BC Managed Care – PPO | Admitting: Obstetrics and Gynecology

## 2019-04-09 ENCOUNTER — Encounter: Payer: Self-pay | Admitting: Obstetrics and Gynecology

## 2019-04-09 ENCOUNTER — Other Ambulatory Visit (HOSPITAL_COMMUNITY)
Admission: RE | Admit: 2019-04-09 | Discharge: 2019-04-09 | Disposition: A | Discharge status: Home / Self Care | Payer: BC Managed Care – PPO | Source: Ambulatory Visit | Attending: Certified Nurse Midwife | Admitting: Certified Nurse Midwife

## 2019-04-09 VITALS — BP 102/55 | HR 89 | Wt 102.3 lb

## 2019-04-09 DIAGNOSIS — Z8619 Personal history of other infectious and parasitic diseases: Secondary | ICD-10-CM | POA: Insufficient documentation

## 2019-04-09 DIAGNOSIS — Z3480 Encounter for supervision of other normal pregnancy, unspecified trimester: Secondary | ICD-10-CM

## 2019-04-09 DIAGNOSIS — Z8669 Personal history of other diseases of the nervous system and sense organs: Secondary | ICD-10-CM

## 2019-04-09 NOTE — Patient Instructions (Signed)
Second Trimester of Pregnancy The second trimester is from week 14 through week 27 (months 4 through 6). The second trimester is often a time when you feel your best. Your body has adjusted to being pregnant, and you begin to feel better physically. Usually, morning sickness has lessened or quit completely, you may have more energy, and you may have an increase in appetite. The second trimester is also a time when the fetus is growing rapidly. At the end of the sixth month, the fetus is about 9 inches long and weighs about 1 pounds. You will likely begin to feel the baby move (quickening) between 16 and 20 weeks of pregnancy. Body changes during your second trimester Your body continues to go through many changes during your second trimester. The changes vary from woman to woman.  Your weight will continue to increase. You will notice your lower abdomen bulging out.  You may begin to get stretch marks on your hips, abdomen, and breasts.  You may develop headaches that can be relieved by medicines. The medicines should be approved by your health care provider.  You may urinate more often because the fetus is pressing on your bladder.  You may develop or continue to have heartburn as a result of your pregnancy.  You may develop constipation because certain hormones are causing the muscles that push waste through your intestines to slow down.  You may develop hemorrhoids or swollen, bulging veins (varicose veins).  You may have back pain. This is caused by: ? Weight gain. ? Pregnancy hormones that are relaxing the joints in your pelvis. ? A shift in weight and the muscles that support your balance.  Your breasts will continue to grow and they will continue to become tender.  Your gums may bleed and may be sensitive to brushing and flossing.  Dark spots or blotches (chloasma, mask of pregnancy) may develop on your face. This will likely fade after the baby is born.  A dark line from your  belly button to the pubic area (linea nigra) may appear. This will likely fade after the baby is born.  You may have changes in your hair. These can include thickening of your hair, rapid growth, and changes in texture. Some women also have hair loss during or after pregnancy, or hair that feels dry or thin. Your hair will most likely return to normal after your baby is born. What to expect at prenatal visits During a routine prenatal visit:  You will be weighed to make sure you and the fetus are growing normally.  Your blood pressure will be taken.  Your abdomen will be measured to track your baby's growth.  The fetal heartbeat will be listened to.  Any test results from the previous visit will be discussed. Your health care provider may ask you:  How you are feeling.  If you are feeling the baby move.  If you have had any abnormal symptoms, such as leaking fluid, bleeding, severe headaches, or abdominal cramping.  If you are using any tobacco products, including cigarettes, chewing tobacco, and electronic cigarettes.  If you have any questions. Other tests that may be performed during your second trimester include:  Blood tests that check for: ? Low iron levels (anemia). ? High blood sugar that affects pregnant women (gestational diabetes) between 24 and 28 weeks. ? Rh antibodies. This is to check for a protein on red blood cells (Rh factor).  Urine tests to check for infections, diabetes, or protein in the   urine.  An ultrasound to confirm the proper growth and development of the baby.  An amniocentesis to check for possible genetic problems.  Fetal screens for spina bifida and Down syndrome.  HIV (human immunodeficiency virus) testing. Routine prenatal testing includes screening for HIV, unless you choose not to have this test. Follow these instructions at home: Medicines  Follow your health care provider's instructions regarding medicine use. Specific medicines may be  either safe or unsafe to take during pregnancy.  Take a prenatal vitamin that contains at least 600 micrograms (mcg) of folic acid.  If you develop constipation, try taking a stool softener if your health care provider approves. Eating and drinking   Eat a balanced diet that includes fresh fruits and vegetables, whole grains, good sources of protein such as meat, eggs, or tofu, and low-fat dairy. Your health care provider will help you determine the amount of weight gain that is right for you.  Avoid raw meat and uncooked cheese. These carry germs that can cause birth defects in the baby.  If you have low calcium intake from food, talk to your health care provider about whether you should take a daily calcium supplement.  Limit foods that are high in fat and processed sugars, such as fried and sweet foods.  To prevent constipation: ? Drink enough fluid to keep your urine clear or pale yellow. ? Eat foods that are high in fiber, such as fresh fruits and vegetables, whole grains, and beans. Activity  Exercise only as directed by your health care provider. Most women can continue their usual exercise routine during pregnancy. Try to exercise for 30 minutes at least 5 days a week. Stop exercising if you experience uterine contractions.  Avoid heavy lifting, wear low heel shoes, and practice good posture.  A sexual relationship may be continued unless your health care provider directs you otherwise. Relieving pain and discomfort  Wear a good support bra to prevent discomfort from breast tenderness.  Take warm sitz baths to soothe any pain or discomfort caused by hemorrhoids. Use hemorrhoid cream if your health care provider approves.  Rest with your legs elevated if you have leg cramps or low back pain.  If you develop varicose veins, wear support hose. Elevate your feet for 15 minutes, 3-4 times a day. Limit salt in your diet. Prenatal Care  Write down your questions. Take them to  your prenatal visits.  Keep all your prenatal visits as told by your health care provider. This is important. Safety  Wear your seat belt at all times when driving.  Make a list of emergency phone numbers, including numbers for family, friends, the hospital, and police and fire departments. General instructions  Ask your health care provider for a referral to a local prenatal education class. Begin classes no later than the beginning of month 6 of your pregnancy.  Ask for help if you have counseling or nutritional needs during pregnancy. Your health care provider can offer advice or refer you to specialists for help with various needs.  Do not use hot tubs, steam rooms, or saunas.  Do not douche or use tampons or scented sanitary pads.  Do not cross your legs for long periods of time.  Avoid cat litter boxes and soil used by cats. These carry germs that can cause birth defects in the baby and possibly loss of the fetus by miscarriage or stillbirth.  Avoid all smoking, herbs, alcohol, and unprescribed drugs. Chemicals in these products can affect the formation   and growth of the baby.  Do not use any products that contain nicotine or tobacco, such as cigarettes and e-cigarettes. If you need help quitting, ask your health care provider.  Visit your dentist if you have not gone yet during your pregnancy. Use a soft toothbrush to brush your teeth and be gentle when you floss. Contact a health care provider if:  You have dizziness.  You have mild pelvic cramps, pelvic pressure, or nagging pain in the abdominal area.  You have persistent nausea, vomiting, or diarrhea.  You have a bad smelling vaginal discharge.  You have pain when you urinate. Get help right away if:  You have a fever.  You are leaking fluid from your vagina.  You have spotting or bleeding from your vagina.  You have severe abdominal cramping or pain.  You have rapid weight gain or weight loss.  You have  shortness of breath with chest pain.  You notice sudden or extreme swelling of your face, hands, ankles, feet, or legs.  You have not felt your baby move in over an hour.  You have severe headaches that do not go away when you take medicine.  You have vision changes. Summary  The second trimester is from week 14 through week 27 (months 4 through 6). It is also a time when the fetus is growing rapidly.  Your body goes through many changes during pregnancy. The changes vary from woman to woman.  Avoid all smoking, herbs, alcohol, and unprescribed drugs. These chemicals affect the formation and growth your baby.  Do not use any tobacco products, such as cigarettes, chewing tobacco, and e-cigarettes. If you need help quitting, ask your health care provider.  Contact your health care provider if you have any questions. Keep all prenatal visits as told by your health care provider. This is important. This information is not intended to replace advice given to you by your health care provider. Make sure you discuss any questions you have with your health care provider. Document Released: 04/17/2001 Document Revised: 08/15/2018 Document Reviewed: 05/29/2016 Elsevier Patient Education  2020 [employer]   Recurrent Migraine Headache  Migraines are a type of headache, and they are usually stronger and more sudden than normal headaches (tension headaches). Migraines are characterized by an intense pulsing, throbbing pain that is usually only present on one side of the head. Sometimes, migraine headaches can cause nausea, vomiting, sensitivity to light and sound, and vision changes. Recurrent migraines keep coming back (recurring). A migraine can last from 4 hours up to 3 days. What are the causes? The exact cause of this condition is not known. However, a migraine may be caused when nerves in the brain become irritated and release chemicals that cause inflammation of blood vessels. This  inflammation causes pain. Certain things may also trigger migraines, such as:  A disruption in your regular eating and sleeping schedule.  Smoking.  Stress.  Menstruation.  Certain foods and drinks, such as: ? Aged cheese. ? Chocolate. ? Alcohol. ? Caffeine. ? Foods or drinks that contain nitrates, glutamate, aspartame, MSG, or tyramine.  Lack of sleep.  Hunger.  Physical exertion.  Fatigue.  High altitude.  Weather changes.  Medicines, such as: ? Nitroglycerin, which is used to treat chest pain. ? Birth control pills. ? Estrogen. ? Some blood pressure medicines. What are the signs or symptoms? Symptoms of this condition vary for each person and may include:  Pain that is usually only present on one side of the  head. In some cases, the pain may be on both sides of the head or around the head or neck.  Pulsating or throbbing pain.  Severe pain that prevents daily activities.  Pain that is aggravated by any physical activity.  Nausea, vomiting, or both.  Dizziness.  Pain with exposure to bright lights, loud noises, or activity.  General sensitivity to bright lights, loud noises, or smells. Before you get a migraine, you may get warning signs that a migraine is coming (aura). An aura may include:  Seeing flashing lights.  Seeing bright spots, halos, or zigzag lines.  Having tunnel vision or blurred vision.  Having numbness or a tingling feeling.  Having trouble talking.  Having muscle weakness.  Smelling a certain odor. How is this diagnosed? This condition is often diagnosed based on:  Your symptoms and medical history.  A physical exam. You may also have tests, including:  A CT scan or MRI of your brain. These imaging tests cannot diagnose migraines, but they can help to rule out other causes of headaches.  Blood tests. How is this treated? This condition is treated with:  Medicines. These are used for: ? Lessening pain and  nausea. ? Preventing recurrent migraines.  Lifestyle changes, such as changes to your diet or sleeping patterns.  Behavior therapy, such as relaxation training or biofeedback. Biofeedback is a treatment that involves teaching you to relax and use your brain to lower your heart rate and control your breathing. Follow these instructions at home: Medicines  Take over-the-counter and prescription medicines only as told by your health care provider.  Do not drive or use heavy machinery while taking prescription pain medicine. Lifestyle  Do not use any products that contain nicotine or tobacco, such as cigarettes and e-cigarettes. If you need help quitting, ask your health care provider.  Limit alcohol intake to no more than 1 drink a day for nonpregnant women and 2 drinks a day for men. One drink equals 12 oz of beer, 5 oz of wine, or 1 oz of hard liquor.  Get 7-9 hours of sleep each night, or the amount of sleep recommended by your health care provider.  Limit your stress. Talk with your health care provider if you need help with stress management.  Maintain a healthy weight. If you need help losing weight, ask your health care provider.  Exercise regularly. Aim for 150 minutes of moderate-intensity exercise (walking, biking, yoga) or 75 minutes of vigorous exercise (running, circuit training, swimming) each week. General instructions   Keep a journal to find out what triggers your migraine headaches so you can avoid these triggers. For example, write down: ? What you eat and drink. ? How much sleep you get. ? Any change to your diet or medicines.  Lie down in a dark, quiet room when you have a migraine.  Try placing a cool towel over your head when you have a migraine.  Keep lights dim, if bright lights bother you and make your migraines worse.  Keep all follow-up visits as told by your health care provider. This is important. Contact a health care provider if:  Your pain does  not improve, even with medicine.  Your migraines continue to return, even with medicine.  You have a fever.  You have weight loss. Get help right away if:  Your migraine becomes severe and medicine does not help.  You have a stiff neck.  You have a loss of vision.  You have muscle weakness or loss  of muscle control.  You start losing your balance or have trouble walking.  You feel faint or you pass out.  You develop new, severe symptoms.  You start having abrupt severe headaches that last for a second or less, like a thunderclap. Summary  Migraine headaches are usually stronger and more sudden than normal headaches (tension headaches). Migraines are characterized by an intense pulsing, throbbing pain that is usually only present on one side of the head.  The exact cause of this condition is not known. However, a migraine may be caused when nerves in the brain become irritated and release chemicals that cause inflammation of blood vessels.  Certain things may trigger migraines, such as changes to diet or sleeping patterns, smoking, certain foods, alcohol, stress, and certain medicines.  Sometimes, migraine headaches can cause nausea, vomiting, sensitivity to light and sound, and vision changes.  Migraines are often diagnosed based on your symptoms, medical history, and a physical exam. This information is not intended to replace advice given to you by your health care provider. Make sure you discuss any questions you have with your health care provider. Document Released: 01/16/2001 Document Revised: 04/26/2017 Document Reviewed: 02/03/2016 Elsevier Patient Education  2020 [employer].

## 2019-04-09 NOTE — Progress Notes (Signed)
Patient comes in today for new OB physical. No concerns today.

## 2019-04-09 NOTE — Progress Notes (Signed)
OBSTETRIC INITIAL PRENATAL VISIT  Subjective:    Abigail Johnston is being seen today for her first obstetrical visit.  This is not a planned pregnancy. She is a 33 y.o. G2P0010 female at 255w6d gestation, Estimated Date of Delivery: 10/09/19 with Patient's last menstrual period was 12/26/2018. consistent with 9 week sono. Her obstetrical history is significant for none.  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 Relationship with FOB: significant other, living together. Patient does intend to breast feed. Pregnancy history fully reviewed.     OB History  Gravida Para Term Preterm AB Living  2 0 0 0 1 0  SAB TAB Ectopic Multiple Live Births  0 0 0 0 0    # Outcome Date GA Lbr Len/2nd Weight Sex Delivery Anes PTL Lv  2 Current           1 AB 2012            Gynecologic History:  Last pap smear was 05/09/2017.  Results were abnormal (NILM, HPV HR+) .    Denies history of STIs.  Contraception: None  Past Medical History:  Diagnosis Date  . Headache   . Migraine headache with aura     Family History  Problem Relation Age of Onset  . Healthy Mother   . Healthy Father   . Breast cancer Maternal Grandmother        deceased in late 5730s  . Cancer Maternal Uncle        unknown type    Past Surgical History:  Procedure Laterality Date  . WISDOM TOOTH EXTRACTION      Social History   Socioeconomic History  . Marital status: Single    Spouse name: Not on file  . Number of children: Not on file  . Years of education: Not on file  . Highest education level: Not on file  Occupational History  . Not on file  Social Needs  . Financial resource strain: Not on file  . Food insecurity    Worry: Not on file    Inability: Not on file  . Transportation needs    Medical: Not on file    Non-medical: Not on file  Tobacco Use  . Smoking status: Never Smoker  . Smokeless tobacco: Never Used  Substance and Sexual Activity  . Alcohol use: No  . Drug  use: No  . Sexual activity: Yes    Birth control/protection: None  Lifestyle  . Physical activity    Days per week: Not on file    Minutes per session: Not on file  . Stress: Not on file  Relationships  . Social Musicianconnections    Talks on phone: Not on file    Gets together: Not on file    Attends religious service: Not on file    Active member of club or organization: Not on file    Attends meetings of clubs or organizations: Not on file    Relationship status: Not on file  . Intimate partner violence    Fear of current or ex partner: Not on file    Emotionally abused: Not on file    Physically abused: Not on file    Forced sexual activity: Not on file  Other Topics Concern  . Not on file  Social History Narrative  . Not on file    Current Outpatient Medications on File Prior to Visit  Medication Sig Dispense Refill  . Prenatal Vit-Fe Fumarate-FA (PRENATAL MULTIVITAMIN) TABS  tablet Take 1 tablet by mouth daily at 12 noon.    . Multiple Vitamins-Minerals (HAIR SKIN AND NAILS FORMULA PO) Take by mouth.     No current facility-administered medications on file prior to visit.     No Known Allergies    Review of Systems General: Not Present- Fever, Weight Loss and Weight Gain. Skin: Not Present- Rash. HEENT: Not Present- Blurred Vision, Headache and Bleeding Gums. Respiratory: Not Present- Difficulty Breathing. Breast: Not Present- Breast Mass. Cardiovascular: Not Present- Chest Pain, Elevated Blood Pressure, Fainting / Blacking Out and Shortness of Breath. Gastrointestinal: Not Present- Abdominal Pain, Constipation, Nausea and Vomiting. Female Genitourinary: Not Present- Frequency, Painful Urination, Pelvic Pain, Vaginal Bleeding, Vaginal Discharge, Contractions, regular, Fetal Movements Decreased, Urinary Complaints and Vaginal Fluid. Musculoskeletal: Not Present- Back Pain and Leg Cramps. Neurological: Not Present- Dizziness.  Positive for headache x 2 days, takes Tylenol  which helps some.  Psychiatric: Not Present- Depression.     Objective:   Blood pressure (!) 102/55, pulse 89, weight 102 lb 4.8 oz (46.4 kg), last menstrual period 12/26/2018.  Body mass index is 20.32 kg/m.  General Appearance:    Alert, cooperative, no distress, appears stated age.  Head:    Normocephalic, without obvious abnormality, atraumatic  Eyes:    PERRL, conjunctiva/corneas clear, EOM's intact, both eyes  Ears:    Normal external ear canals, both ears  Nose:   Nares normal, septum midline, mucosa normal, no drainage or sinus tenderness  Throat:   Lips, mucosa, and tongue normal; teeth and gums normal  Neck:   Supple, symmetrical, trachea midline, no adenopathy; thyroid: no enlargement/tenderness/nodules; no carotid bruit or JVD  Back:     Symmetric, no curvature, ROM normal, no CVA tenderness  Lungs:     Clear to auscultation bilaterally, respirations unlabored  Chest Wall:    No tenderness or deformity   Heart:    Regular rate and rhythm, S1 and S2 normal, no murmur, rub or gallop  Breast Exam:    No tenderness, masses, or nipple abnormality  Abdomen:     Soft, non-tender, bowel sounds active all four quadrants, no masses, no organomegaly.  FH 13.  FHT 152  bpm.  Genitalia:    Pelvic:external genitalia normal, vagina without lesions, discharge, or tenderness, rectovaginal septum  normal. Cervix normal in appearance, no cervical motion tenderness, no adnexal masses or tenderness.  Pregnancy positive findings: uterine enlargement: 13 wk size, nontender.   Rectal:    Normal external sphincter.  No hemorrhoids appreciated. Internal exam not done.   Extremities:   Extremities normal, atraumatic, no cyanosis or edema  Pulses:   2+ and symmetric all extremities  Skin:   Skin color, texture, turgor normal, no rashes or lesions  Lymph nodes:   Cervical, supraclavicular, and axillary nodes normal  Neurologic:   CNII-XII intact, normal strength, sensation and reflexes throughout       Assessment:    Pregnancy at 13 and 6/7 weeks    Migraines  History of HPV infection  Plan:    Initial labs reviewed.   Pap smear performed due to history of prior + HPV pap in 2019.  Prenatal vitamins encouraged. Problem list reviewed and updated. New OB counseling:  The patient has been given an overview regarding routine prenatal care.  Recommendations regarding diet, weight gain, and exercise in pregnancy were given. Prenatal testing, optional genetic testing, and ultrasound use in pregnancy were reviewed.  Cell-free DNA testing performed (Panorama), normal  Benefits of Breast Feeding  were discussed. The patient is encouraged to consider nursing her baby post partum. Patient with h/o migraines, was on Elavil for preventative therapy but has not taken in some time. Discussed that migraines have the potential to worsen or remain the same during pregnancy. Advised that she can restart on medication if desired, is Class C. Discussed risks/benefits.  Also discussed alternatives such as Fioricet, Propranolol for treatment and prevention.  Patient notes she will continue with Tylenol and warm compresses for now.  Follow up in 4 weeks.  50% of 30 min visit spent on counseling and coordination of care.    Hildred Laser, MD [facility]

## 2019-04-10 ENCOUNTER — Encounter: Payer: Self-pay | Admitting: Obstetrics and Gynecology

## 2019-04-15 LAB — CYTOLOGY - PAP
Comment: NEGATIVE
Diagnosis: NEGATIVE
High risk HPV: NEGATIVE

## 2019-05-08 NOTE — L&D Delivery Note (Signed)
       Delivery Note   Abigail Johnston is a 34 y.o. G2P0010 at [redacted]w[redacted]d Estimated Date of Delivery: 10/09/19  PRE-OPERATIVE DIAGNOSIS:  1) [redacted]w[redacted]d pregnancy.  2) Cat 2 strip  POST-OPERATIVE DIAGNOSIS:  1) [redacted]w[redacted]d pregnancy s/p Vaginal, Vacuum (Extractor)  2) tight nuchal cord 3) viable female infant  Delivery Type: Vaginal, Vacuum (Extractor)    Delivery Anesthesia: Epidural   Labor Complications:  Cat 2 monitor strip    ESTIMATED BLOOD LOSS: 0  ml    FINDINGS:   1) female infant, Apgar scores of 8   at 1 minute and 9   at 5 minutes and a birthweight of 81.83  ounces.    2) Nuchal cord: Yes tight  SPECIMENS:   PLACENTA:   Appearance: Intact    Removal: Spontaneous      Disposition:    DISPOSITION:  Infant to left in stable condition in the delivery room, with L&D personnel and mother,  NARRATIVE SUMMARY: Labor course:  [name] is a G2P0010 at [redacted]w[redacted]d who presented for SROM and irregular contractions.  She required Pitocin and then progressed well in labor. She received appropriate anesthesia and proceeded to complete dilation. She evidenced good maternal expulsive effort during the second stage.  Fetal HR decelerations to the 70s occurred during second stage occasionally with slow recovery between contractions. A vacuum delivery was indicated. After discussing with the patient, the vacuum was applied with the fetal vertex visible on the perineum. Traction was employed over the next contraction.  At the end of the contraction a pop-off occurred.  The vacuum was replaced and over the next contraction traction was employed.  With the vertex crowning plus the vacuum was removed and the pt delivered the vertex.  A very tight nuchal cord was present and had to be cut on the perineum. The infant did well.  The placenta delivered without problems and was noted to be complete. A perineal and vaginal examination was performed. Episiotomy/Lacerations: Labial  Episiotomy or  lacerations were repaired with Vicryl suture using local anesthesia. The patient tolerated this well.  Elonda Husky, M.D. 10/04/2019 10:32 AM

## 2019-05-12 ENCOUNTER — Ambulatory Visit (INDEPENDENT_AMBULATORY_CARE_PROVIDER_SITE_OTHER): Payer: BC Managed Care – PPO | Admitting: Obstetrics and Gynecology

## 2019-05-12 ENCOUNTER — Other Ambulatory Visit: Payer: Self-pay

## 2019-05-12 VITALS — BP 94/61 | HR 83 | Wt 104.8 lb

## 2019-05-12 DIAGNOSIS — Z3482 Encounter for supervision of other normal pregnancy, second trimester: Secondary | ICD-10-CM

## 2019-05-12 DIAGNOSIS — Z3A18 18 weeks gestation of pregnancy: Secondary | ICD-10-CM

## 2019-05-12 DIAGNOSIS — Z3480 Encounter for supervision of other normal pregnancy, unspecified trimester: Secondary | ICD-10-CM

## 2019-05-12 LAB — POCT URINALYSIS DIPSTICK OB
Bilirubin, UA: NEGATIVE
Blood, UA: NEGATIVE
Glucose, UA: NEGATIVE
Ketones, UA: NEGATIVE
Leukocytes, UA: NEGATIVE
Nitrite, UA: NEGATIVE
POC,PROTEIN,UA: NEGATIVE
Spec Grav, UA: 1.01 (ref 1.010–1.025)
Urobilinogen, UA: 0.2 E.U./dL
pH, UA: 6.5 (ref 5.0–8.0)

## 2019-05-12 NOTE — Progress Notes (Signed)
ROB: Patient denies problems.  Feels better than first trimester.  AFP today.  FAS next visit The following were addressed during this visit:  Breastfeeding Education - Early initiation of breastfeeding    Comments: Keeps milk supply adequate, helps contract uterus and slow bleeding, and early milk is the perfect first food and is easy to digest.   - The importance of exclusive breastfeeding    Comments: Provides antibodies, Lower risk of breast and ovarian cancers, and type-2 diabetes,Helps your body recover, Reduced chance of SIDS.   - The importance of early skin-to-skin contact    Comments: Keeps baby warm and secure, helps keep baby's blood sugar up and breathing steady, easier to bond and breastfeed, and helps calm baby.

## 2019-05-14 LAB — AFP, SERUM, OPEN SPINA BIFIDA
AFP MoM: 2.22
AFP Value: 137 ng/mL
Gest. Age on Collection Date: 18 weeks
Maternal Age At EDD: 34.7 yr
OSBR Risk 1 IN: 1004
Test Results:: NEGATIVE
Weight: 104 [lb_av]

## 2019-06-03 ENCOUNTER — Encounter: Payer: Self-pay | Admitting: Obstetrics and Gynecology

## 2019-06-03 ENCOUNTER — Ambulatory Visit (INDEPENDENT_AMBULATORY_CARE_PROVIDER_SITE_OTHER): Payer: BC Managed Care – PPO

## 2019-06-03 ENCOUNTER — Ambulatory Visit (INDEPENDENT_AMBULATORY_CARE_PROVIDER_SITE_OTHER): Payer: BC Managed Care – PPO | Admitting: Obstetrics and Gynecology

## 2019-06-03 ENCOUNTER — Other Ambulatory Visit: Payer: Self-pay

## 2019-06-03 VITALS — BP 92/58 | HR 76 | Wt 109.0 lb

## 2019-06-03 DIAGNOSIS — Z3482 Encounter for supervision of other normal pregnancy, second trimester: Secondary | ICD-10-CM

## 2019-06-03 DIAGNOSIS — Z3A22 22 weeks gestation of pregnancy: Secondary | ICD-10-CM

## 2019-06-03 DIAGNOSIS — Z3A21 21 weeks gestation of pregnancy: Secondary | ICD-10-CM

## 2019-06-03 DIAGNOSIS — Z3480 Encounter for supervision of other normal pregnancy, unspecified trimester: Secondary | ICD-10-CM

## 2019-06-03 LAB — POCT URINALYSIS DIPSTICK OB
Bilirubin, UA: NEGATIVE
Blood, UA: NEGATIVE
Glucose, UA: NEGATIVE
Ketones, UA: NEGATIVE
Leukocytes, UA: NEGATIVE
Nitrite, UA: NEGATIVE
POC,PROTEIN,UA: NEGATIVE
Spec Grav, UA: 1.015 (ref 1.010–1.025)
Urobilinogen, UA: 0.2 E.U./dL
pH, UA: 6.5 (ref 5.0–8.0)

## 2019-06-03 NOTE — Patient Instructions (Signed)

## 2019-06-03 NOTE — Progress Notes (Signed)
ROB-Pt present for routine prenatal care and anatomy ultrasound. Pt stated that she was doing well no problems.

## 2019-06-03 NOTE — Progress Notes (Signed)
ROB: Doing well, no complaints. S/p normal anatomy scan today.  Is considering natural child birth. Discussed doula program, patient notes her cousin is a doula and may utilize. Ready set baby completed today. Discussed COVID vaccine in pregnancy, all questions answered. RTC in 4 weeks.

## 2019-07-01 ENCOUNTER — Ambulatory Visit (INDEPENDENT_AMBULATORY_CARE_PROVIDER_SITE_OTHER): Payer: BC Managed Care – PPO | Admitting: Obstetrics and Gynecology

## 2019-07-01 ENCOUNTER — Encounter: Payer: Self-pay | Admitting: Obstetrics and Gynecology

## 2019-07-01 ENCOUNTER — Other Ambulatory Visit: Payer: Self-pay

## 2019-07-01 VITALS — BP 96/64 | HR 89 | Wt 115.5 lb

## 2019-07-01 DIAGNOSIS — Z3482 Encounter for supervision of other normal pregnancy, second trimester: Secondary | ICD-10-CM

## 2019-07-01 NOTE — Progress Notes (Signed)
ROB: No complaints.  Reports that she is doing well.  1 hour GCT next visit.

## 2019-07-07 ENCOUNTER — Telehealth: Payer: Self-pay | Admitting: Obstetrics and Gynecology

## 2019-07-07 NOTE — Telephone Encounter (Signed)
Pt called in and stated that she is having yeast infection symptoms. The pt is requesting a call back. Please advise

## 2019-07-09 NOTE — Telephone Encounter (Signed)
Spoke with pt concerning her yeast infection. Pt was advised to use the medication on her safe medication list that was given to her at the nurse intake. Pt stated that she had the list. Sent pt another list via mychart. Pt was advised that if she has no relief after a week to schedule an appointment with a provider.

## 2019-07-14 ENCOUNTER — Encounter: Payer: BC Managed Care – PPO | Admitting: Obstetrics and Gynecology

## 2019-07-21 NOTE — Progress Notes (Signed)
ROB-PT present for routine prenatal care. BTC signed/Tdap administered. Pt c/o lower abd pain/pressure. Denies any other issues.

## 2019-07-21 NOTE — Patient Instructions (Addendum)
https://www.cdc.gov/vaccines/hcp/vis/vis-statements/tdap.pdf">  Tdap (Tetanus, Diphtheria, Pertussis) Vaccine: What You Need to Know 1. Why get vaccinated? Tdap vaccine can prevent tetanus, diphtheria, and pertussis. Diphtheria and pertussis spread from person to person. Tetanus enters the body through cuts or wounds.  TETANUS (T) causes painful stiffening of the muscles. Tetanus can lead to serious health problems, including being unable to open the mouth, having trouble swallowing and breathing, or death.  DIPHTHERIA (D) can lead to difficulty breathing, heart failure, paralysis, or death.  PERTUSSIS (aP), also known as "whooping cough," can cause uncontrollable, violent coughing which makes it hard to breathe, eat, or drink. Pertussis can be extremely serious in babies and young children, causing pneumonia, convulsions, brain damage, or death. In teens and adults, it can cause weight loss, loss of bladder control, passing out, and rib fractures from severe coughing. 2. Tdap vaccine Tdap is only for children 7 years and older, adolescents, and adults.  Adolescents should receive a single dose of Tdap, preferably at age 53 or 35 years. Pregnant women should get a dose of Tdap during every pregnancy, to protect the newborn from pertussis. Infants are most at risk for severe, life-threatening complications from pertussis. Adults who have never received Tdap should get a dose of Tdap. Also, adults should receive a booster dose every 10 years, or earlier in the case of a severe and dirty wound or burn. Booster doses can be either Tdap or Td (a different vaccine that protects against tetanus and diphtheria but not pertussis). Tdap may be given at the same time as other vaccines. 3. Talk with your health care provider Tell your vaccine provider if the person getting the vaccine:  Has had an allergic reaction after a previous dose of any vaccine that protects against tetanus, diphtheria, or pertussis,  or has any severe, life-threatening allergies.  Has had a coma, decreased level of consciousness, or prolonged seizures within 7 days after a previous dose of any pertussis vaccine (DTP, DTaP, or Tdap).  Has seizures or another nervous system problem.  Has ever had Guillain-Barr Syndrome (also called GBS).  Has had severe pain or swelling after a previous dose of any vaccine that protects against tetanus or diphtheria. In some cases, your health care provider may decide to postpone Tdap vaccination to a future visit.  People with minor illnesses, such as a cold, may be vaccinated. People who are moderately or severely ill should usually wait until they recover before getting Tdap vaccine.  Your health care provider can give you more information. 4. Risks of a vaccine reaction  Pain, redness, or swelling where the shot was given, mild fever, headache, feeling tired, and nausea, vomiting, diarrhea, or stomachache sometimes happen after Tdap vaccine. People sometimes faint after medical procedures, including vaccination. Tell your provider if you feel dizzy or have vision changes or ringing in the ears.  As with any medicine, there is a very remote chance of a vaccine causing a severe allergic reaction, other serious injury, or death. 5. What if there is a serious problem? An allergic reaction could occur after the vaccinated person leaves the clinic. If you see signs of a severe allergic reaction (hives, swelling of the face and throat, difficulty breathing, a fast heartbeat, dizziness, or weakness), call 9-1-1 and get the person to the nearest hospital. For other signs that concern you, call your health care provider.  Adverse reactions should be reported to the Vaccine Adverse Event Reporting System (VAERS). Your health care provider will usually file this report,  or you can do it yourself. Visit the VAERS website at www.vaers.SamedayNews.es or call (631)125-5379. VAERS is only for reporting  reactions, and VAERS staff do not give medical advice. 6. The National Vaccine Injury Compensation Program The [employer] Vaccine Injury Compensation Program (VICP) is a federal program that was created to compensate people who may have been injured by certain vaccines. Visit the VICP website at GoldCloset.com.ee or call 812 508 1532 to learn about the program and about filing a claim. There is a time limit to file a claim for compensation. 7. How can I learn more?  Ask your health care provider.  Call your local or state health department.  Contact the Centers for Disease Control and Prevention (CDC): ? Call 774 494 9484 (1-800-CDC-INFO) or ? Visit CDC's website at http://hunter.com/ Vaccine Information Statement Tdap (Tetanus, Diphtheria, Pertussis) Vaccine (08/06/2018) This information is not intended to replace advice given to you by your health care provider. Make sure you discuss any questions you have with your health care provider. Document Revised: 08/15/2018 Document Reviewed: 08/18/2018 Elsevier Patient Education  [employer].      Common Medications Safe in Pregnancy  Acne:      Constipation:  Benzoyl Peroxide     Colace  Clindamycin      Dulcolax Suppository  Topica Erythromycin     Fibercon  Salicylic Acid      Metamucil         Miralax AVOID:        Senakot   Accutane    Cough:  Retin-A       Cough Drops  Tetracycline      Phenergan w/ Codeine if Rx  Minocycline      Robitussin (Plain & DM)  Antibiotics:     Crabs/Lice:  Ceclor       RID  Cephalosporins    AVOID:  E-Mycins      Kwell  Keflex  Macrobid/Macrodantin   Diarrhea:  Penicillin      Kao-Pectate  Zithromax      Imodium AD         PUSH FLUIDS AVOID:       Cipro     Fever:  Tetracycline      Tylenol (Regular or Extra  Minocycline       Strength)  Levaquin      Extra Strength-Do not          Exceed 8 tabs/24 hrs Caffeine:        <215m/day (equiv. To 1 cup of coffee  or  approx. 3 12 oz sodas)         Gas: Cold/Hayfever:       Gas-X  Benadryl      Mylicon  Claritin       Phazyme  **Claritin-D        Chlor-Trimeton    Headaches:  Dimetapp      ASA-Free Excedrin  Drixoral-Non-Drowsy     Cold Compress  Mucinex (Guaifenasin)     Tylenol (Regular or Extra  Sudafed/Sudafed-12 Hour     Strength)  **Sudafed PE Pseudoephedrine   Tylenol Cold & Sinus     Vicks Vapor Rub  Zyrtec  **AVOID if Problems With Blood Pressure         Heartburn: Avoid lying down for at least 1 hour after meals  Aciphex      Maalox     Rash:  Milk of Magnesia     Benadryl    Mylanta       1% Hydrocortisone  Cream  Pepcid  Pepcid Complete   Sleep Aids:  Prevacid      Ambien   Prilosec       Benadryl  Rolaids       Chamomile Tea  Tums (Limit 4/day)     Unisom  Zantac       Tylenol PM         Warm milk-add vanilla or  Hemorrhoids:       Sugar for taste  Anusol/Anusol H.C.  (RX: Analapram 2.5%)  Sugar Substitutes:  Hydrocortisone OTC     Ok in moderation  Preparation H      Tucks        Vaseline lotion applied to tissue with wiping    Herpes:     Throat:  Acyclovir      Oragel  Famvir  Valtrex     Vaccines:         Flu Shot Leg Cramps:       *Gardasil  Benadryl      Hepatitis A         Hepatitis B Nasal Spray:       Pneumovax  Saline Nasal Spray     Polio Booster         Tetanus Nausea:       Tuberculosis test or PPD  Vitamin B6 25 mg TID   AVOID:    Dramamine      *Gardasil  Emetrol       Live Poliovirus  Ginger Root 250 mg QID    MMR (measles, mumps &  High Complex Carbs @ Bedtime    rebella)  Sea Bands-Accupressure    Varicella (Chickenpox)  Unisom 1/2 tab TID     *No known complications           If received before Pain:         Known pregnancy;   Darvocet       Resume series after  Lortab        Delivery  Percocet    Yeast:   Tramadol      Femstat  Tylenol 3      Gyne-lotrimin  Ultram       Monistat  Vicodin           MISC:         All  Sunscreens           Hair Coloring/highlights          Insect Repellant's          (Including DEET)         Mystic Tans Third Trimester of Pregnancy  The third trimester is from week 28 through week 40 (months 7 through 9). This trimester is when your unborn baby (fetus) is growing very fast. At the end of the ninth month, the unborn baby is about 20 inches in length. It weighs about 6-10 pounds. Follow these instructions at home: Medicines  Take over-the-counter and prescription medicines only as told by your doctor. Some medicines are safe and some medicines are not safe during pregnancy.  Take a prenatal vitamin that contains at least 600 micrograms (mcg) of folic acid.  If you have trouble pooping (constipation), take medicine that will make your stool soft (stool softener) if your doctor approves. Eating and drinking   Eat regular, healthy meals.  Avoid raw meat and uncooked cheese.  If you get low calcium from the food you eat, talk to your doctor about taking a daily calcium supplement.  Eat four or five small meals rather than three large meals a day.  Avoid foods that are high in fat and sugars, such as fried and sweet foods.  To prevent constipation: ? Eat foods that are high in fiber, like fresh fruits and vegetables, whole grains, and beans. ? Drink enough fluids to keep your pee (urine) clear or pale yellow. Activity  Exercise only as told by your doctor. Stop exercising if you start to have cramps.  Avoid heavy lifting, wear low heels, and sit up straight.  Do not exercise if it is too hot, too humid, or if you are in a place of great height (high altitude).  You may continue to have sex unless your doctor tells you not to. Relieving pain and discomfort  Wear a good support bra if your breasts are tender.  Take frequent breaks and rest with your legs raised if you have leg cramps or low back pain.  Take warm water baths (sitz baths) to soothe pain or  discomfort caused by hemorrhoids. Use hemorrhoid cream if your doctor approves.  If you develop puffy, bulging veins (varicose veins) in your legs: ? Wear support hose or compression stockings as told by your doctor. ? Raise (elevate) your feet for 15 minutes, 3-4 times a day. ? Limit salt in your food. Safety  Wear your seat belt when driving.  Make a list of emergency phone numbers, including numbers for family, friends, the hospital, and police and fire departments. Preparing for your baby's arrival To prepare for the arrival of your baby:  Take prenatal classes.  Practice driving to the hospital.  Visit the hospital and tour the maternity area.  Talk to your work about taking leave once the baby comes.  Pack your hospital bag.  Prepare the baby's room.  Go to your doctor visits.  Buy a rear-facing car seat. Learn how to install it in your car. General instructions  Do not use hot tubs, steam rooms, or saunas.  Do not use any products that contain nicotine or tobacco, such as cigarettes and e-cigarettes. If you need help quitting, ask your doctor.  Do not drink alcohol.  Do not douche or use tampons or scented sanitary pads.  Do not cross your legs for long periods of time.  Do not travel for long distances unless you must. Only do so if your doctor says it is okay.  Visit your dentist if you have not gone during your pregnancy. Use a soft toothbrush to brush your teeth. Be gentle when you floss.  Avoid cat litter boxes and soil used by cats. These carry germs that can cause birth defects in the baby and can cause a loss of your baby (miscarriage) or stillbirth.  Keep all your prenatal visits as told by your doctor. This is important. Contact a doctor if:  You are not sure if you are in labor or if your water has broken.  You are dizzy.  You have mild cramps or pressure in your lower belly.  You have a nagging pain in your belly area.  You continue to feel  sick to your stomach, you throw up, or you have watery poop.  You have bad smelling fluid coming from your vagina.  You have pain when you pee. Get help right away if:  You have a fever.  You are leaking fluid from your vagina.  You are spotting or bleeding from your vagina.  You have severe belly cramps or pain.  You  lose or gain weight quickly.  You have trouble catching your breath and have chest pain.  You notice sudden or extreme puffiness (swelling) of your face, hands, ankles, feet, or legs.  You have not felt the baby move in over an hour.  You have severe headaches that do not go away with medicine.  You have trouble seeing.  You are leaking, or you are having a gush of fluid, from your vagina before you are 37 weeks.  You have regular belly spasms (contractions) before you are 37 weeks. Summary  The third trimester is from week 28 through week 40 (months 7 through 9). This time is when your unborn baby is growing very fast.  Follow your doctor's advice about medicine, food, and activity.  Get ready for the arrival of your baby by taking prenatal classes, getting all the baby items ready, preparing the baby's room, and visiting your doctor to be checked.  Get help right away if you are bleeding from your vagina, or you have chest pain and trouble catching your breath, or if you have not felt your baby move in over an hour. This information is not intended to replace advice given to you by your health care provider. Make sure you discuss any questions you have with your health care provider. Document Revised: 08/14/2018 Document Reviewed: 05/29/2016 Elsevier Patient Education  2020 [employer]. Breastfeeding  Choosing to breastfeed is one of the best decisions you can make for yourself and your baby. A change in hormones during pregnancy causes your breasts to make breast milk in your milk-producing glands. Hormones prevent breast milk from being released before  your baby is born. They also prompt milk flow after birth. Once breastfeeding has begun, thoughts of your baby, as well as his or her sucking or crying, can stimulate the release of milk from your milk-producing glands. Benefits of breastfeeding Research shows that breastfeeding offers many health benefits for infants and mothers. It also offers a cost-free and convenient way to feed your baby. For your baby  Your first milk (colostrum) helps your baby's digestive system to function better.  Special cells in your milk (antibodies) help your baby to fight off infections.  Breastfed babies are less likely to develop asthma, allergies, obesity, or type 2 diabetes. They are also at lower risk for sudden infant death syndrome (SIDS).  Nutrients in breast milk are better able to meet your baby's needs compared to infant formula.  Breast milk improves your baby's brain development. For you  Breastfeeding helps to create a very special bond between you and your baby.  Breastfeeding is convenient. Breast milk costs nothing and is always available at the correct temperature.  Breastfeeding helps to burn calories. It helps you to lose the weight that you gained during pregnancy.  Breastfeeding makes your uterus return faster to its size before pregnancy. It also slows bleeding (lochia) after you give birth.  Breastfeeding helps to lower your risk of developing type 2 diabetes, osteoporosis, rheumatoid arthritis, cardiovascular disease, and breast, ovarian, uterine, and endometrial cancer later in life. Breastfeeding basics Starting breastfeeding  Find a comfortable place to sit or lie down, with your neck and back well-supported.  Place a pillow or a rolled-up blanket under your baby to bring him or her to the level of your breast (if you are seated). Nursing pillows are specially designed to help support your arms and your baby while you breastfeed.  Make sure that your baby's tummy (abdomen)  is facing  your abdomen.  Gently massage your breast. With your fingertips, massage from the outer edges of your breast inward toward the nipple. This encourages milk flow. If your milk flows slowly, you may need to continue this action during the feeding.  Support your breast with 4 fingers underneath and your thumb above your nipple (make the letter "C" with your hand). Make sure your fingers are well away from your nipple and your baby's mouth.  Stroke your baby's lips gently with your finger or nipple.  When your baby's mouth is open wide enough, quickly bring your baby to your breast, placing your entire nipple and as much of the areola as possible into your baby's mouth. The areola is the colored area around your nipple. ? More areola should be visible above your baby's upper lip than below the lower lip. ? Your baby's lips should be opened and extended outward (flanged) to ensure an adequate, comfortable latch. ? Your baby's tongue should be between his or her lower gum and your breast.  Make sure that your baby's mouth is correctly positioned around your nipple (latched). Your baby's lips should create a seal on your breast and be turned out (everted).  It is common for your baby to suck about 2-3 minutes in order to start the flow of breast milk. Latching Teaching your baby how to latch onto your breast properly is very important. An improper latch can cause nipple pain, decreased milk supply, and poor weight gain in your baby. Also, if your baby is not latched onto your nipple properly, he or she may swallow some air during feeding. This can make your baby fussy. Burping your baby when you switch breasts during the feeding can help to get rid of the air. However, teaching your baby to latch on properly is still the best way to prevent fussiness from swallowing air while breastfeeding. Signs that your baby has successfully latched onto your nipple  Silent tugging or silent sucking,  without causing you pain. Infant's lips should be extended outward (flanged).  Swallowing heard between every 3-4 sucks once your milk has started to flow (after your let-down milk reflex occurs).  Muscle movement above and in front of his or her ears while sucking. Signs that your baby has not successfully latched onto your nipple  Sucking sounds or smacking sounds from your baby while breastfeeding.  Nipple pain. If you think your baby has not latched on correctly, slip your finger into the corner of your baby's mouth to break the suction and place it between your baby's gums. Attempt to start breastfeeding again. Signs of successful breastfeeding Signs from your baby  Your baby will gradually decrease the number of sucks or will completely stop sucking.  Your baby will fall asleep.  Your baby's body will relax.  Your baby will retain a small amount of milk in his or her mouth.  Your baby will let go of your breast by himself or herself. Signs from you  Breasts that have increased in firmness, weight, and size 1-3 hours after feeding.  Breasts that are softer immediately after breastfeeding.  Increased milk volume, as well as a change in milk consistency and color by the fifth day of breastfeeding.  Nipples that are not sore, cracked, or bleeding. Signs that your baby is getting enough milk  Wetting at least 1-2 diapers during the first 24 hours after birth.  Wetting at least 5-6 diapers every 24 hours for the first week after birth. The urine  should be clear or pale yellow by the age of 5 days.  Wetting 6-8 diapers every 24 hours as your baby continues to grow and develop.  At least 3 stools in a 24-hour period by the age of 5 days. The stool should be soft and yellow.  At least 3 stools in a 24-hour period by the age of 7 days. The stool should be seedy and yellow.  No loss of weight greater than 10% of birth weight during the first 3 days of life.  Average weight  gain of 4-7 oz (113-198 g) per week after the age of 4 days.  Consistent daily weight gain by the age of 5 days, without weight loss after the age of 2 weeks. After a feeding, your baby may spit up a small amount of milk. This is normal. Breastfeeding frequency and duration Frequent feeding will help you make more milk and can prevent sore nipples and extremely full breasts (breast engorgement). Breastfeed when you feel the need to reduce the fullness of your breasts or when your baby shows signs of hunger. This is called "breastfeeding on demand." Signs that your baby is hungry include:  Increased alertness, activity, or restlessness.  Movement of the head from side to side.  Opening of the mouth when the corner of the mouth or cheek is stroked (rooting).  Increased sucking sounds, smacking lips, cooing, sighing, or squeaking.  Hand-to-mouth movements and sucking on fingers or hands.  Fussing or crying. Avoid introducing a pacifier to your baby in the first 4-6 weeks after your baby is born. After this time, you may choose to use a pacifier. Research has shown that pacifier use during the first year of a baby's life decreases the risk of sudden infant death syndrome (SIDS). Allow your baby to feed on each breast as long as he or she wants. When your baby unlatches or falls asleep while feeding from the first breast, offer the second breast. Because newborns are often sleepy in the first few weeks of life, you may need to awaken your baby to get him or her to feed. Breastfeeding times will vary from baby to baby. However, the following rules can serve as a guide to help you make sure that your baby is properly fed:  Newborns (babies 2 weeks of age or younger) may breastfeed every 1-3 hours.  Newborns should not go without breastfeeding for longer than 3 hours during the day or 5 hours during the night.  You should breastfeed your baby a minimum of 8 times in a 24-hour period. Breast milk  pumping     Pumping and storing breast milk allows you to make sure that your baby is exclusively fed your breast milk, even at times when you are unable to breastfeed. This is especially important if you go back to work while you are still breastfeeding, or if you are not able to be present during feedings. Your lactation consultant can help you find a method of pumping that works best for you and give you guidelines about how long it is safe to store breast milk. Caring for your breasts while you breastfeed Nipples can become dry, cracked, and sore while breastfeeding. The following recommendations can help keep your breasts moisturized and healthy:  Avoid using soap on your nipples.  Wear a supportive bra designed especially for nursing. Avoid wearing underwire-style bras or extremely tight bras (sports bras).  Air-dry your nipples for 3-4 minutes after each feeding.  Use only cotton bra  pads to absorb leaked breast milk. Leaking of breast milk between feedings is normal.  Use lanolin on your nipples after breastfeeding. Lanolin helps to maintain your skin's normal moisture barrier. Pure lanolin is not harmful (not toxic) to your baby. You may also hand express a few drops of breast milk and gently massage that milk into your nipples and allow the milk to air-dry. In the first few weeks after giving birth, some women experience breast engorgement. Engorgement can make your breasts feel heavy, warm, and tender to the touch. Engorgement peaks within 3-5 days after you give birth. The following recommendations can help to ease engorgement:  Completely empty your breasts while breastfeeding or pumping. You may want to start by applying warm, moist heat (in the shower or with warm, water-soaked hand towels) just before feeding or pumping. This increases circulation and helps the milk flow. If your baby does not completely empty your breasts while breastfeeding, pump any extra milk after he or she is  finished.  Apply ice packs to your breasts immediately after breastfeeding or pumping, unless this is too uncomfortable for you. To do this: ? Put ice in a plastic bag. ? Place a towel between your skin and the bag. ? Leave the ice on for 20 minutes, 2-3 times a day.  Make sure that your baby is latched on and positioned properly while breastfeeding. If engorgement persists after 48 hours of following these recommendations, contact your health care provider or a Science writer. Overall health care recommendations while breastfeeding  Eat 3 healthy meals and 3 snacks every day. Well-nourished mothers who are breastfeeding need an additional 450-500 calories a day. You can meet this requirement by increasing the amount of a balanced diet that you eat.  Drink enough water to keep your urine pale yellow or clear.  Rest often, relax, and continue to take your prenatal vitamins to prevent fatigue, stress, and low vitamin and mineral levels in your body (nutrient deficiencies).  Do not use any products that contain nicotine or tobacco, such as cigarettes and e-cigarettes. Your baby may be harmed by chemicals from cigarettes that pass into breast milk and exposure to secondhand smoke. If you need help quitting, ask your health care provider.  Avoid alcohol.  Do not use illegal drugs or marijuana.  Talk with your health care provider before taking any medicines. These include over-the-counter and prescription medicines as well as vitamins and herbal supplements. Some medicines that may be harmful to your baby can pass through breast milk.  It is possible to become pregnant while breastfeeding. If birth control is desired, ask your health care provider about options that will be safe while breastfeeding your baby. Where to find more information: [employer]: www.llli.org Contact a health care provider if:  You feel like you want to stop breastfeeding or have become  frustrated with breastfeeding.  Your nipples are cracked or bleeding.  Your breasts are red, tender, or warm.  You have: ? Painful breasts or nipples. ? A swollen area on either breast. ? A fever or chills. ? Nausea or vomiting. ? Drainage other than breast milk from your nipples.  Your breasts do not become full before feedings by the fifth day after you give birth.  You feel sad and depressed.  Your baby is: ? Too sleepy to eat well. ? Having trouble sleeping. ? More than 25 week old and wetting fewer than 6 diapers in a 24-hour period. ? Not gaining weight by 5  days of age.  Your baby has fewer than 3 stools in a 24-hour period.  Your baby's skin or the white parts of his or her eyes become yellow. Get help right away if:  Your baby is overly tired (lethargic) and does not want to wake up and feed.  Your baby develops an unexplained fever. Summary  Breastfeeding offers many health benefits for infant and mothers.  Try to breastfeed your infant when he or she shows early signs of hunger.  Gently tickle or stroke your baby's lips with your finger or nipple to allow the baby to open his or her mouth. Bring the baby to your breast. Make sure that much of the areola is in your baby's mouth. Offer one side and burp the baby before you offer the other side.  Talk with your health care provider or lactation consultant if you have questions or you face problems as you breastfeed. This information is not intended to replace advice given to you by your health care provider. Make sure you discuss any questions you have with your health care provider. Document Revised: 07/18/2017 Document Reviewed: 05/25/2016 Elsevier Patient Education  [employer].

## 2019-07-22 ENCOUNTER — Ambulatory Visit (INDEPENDENT_AMBULATORY_CARE_PROVIDER_SITE_OTHER): Payer: BC Managed Care – PPO | Admitting: Obstetrics and Gynecology

## 2019-07-22 ENCOUNTER — Encounter: Payer: Self-pay | Admitting: Obstetrics and Gynecology

## 2019-07-22 ENCOUNTER — Other Ambulatory Visit: Payer: Self-pay

## 2019-07-22 ENCOUNTER — Other Ambulatory Visit: Payer: BC Managed Care – PPO

## 2019-07-22 VITALS — BP 83/49 | HR 89 | Wt 120.3 lb

## 2019-07-22 DIAGNOSIS — Z3483 Encounter for supervision of other normal pregnancy, third trimester: Secondary | ICD-10-CM

## 2019-07-22 DIAGNOSIS — Z3482 Encounter for supervision of other normal pregnancy, second trimester: Secondary | ICD-10-CM

## 2019-07-22 DIAGNOSIS — Z3A28 28 weeks gestation of pregnancy: Secondary | ICD-10-CM

## 2019-07-22 DIAGNOSIS — R102 Pelvic and perineal pain: Secondary | ICD-10-CM

## 2019-07-22 DIAGNOSIS — Z23 Encounter for immunization: Secondary | ICD-10-CM

## 2019-07-22 LAB — POCT URINALYSIS DIPSTICK OB
Bilirubin, UA: NEGATIVE
Blood, UA: NEGATIVE
Glucose, UA: NEGATIVE
Ketones, UA: NEGATIVE
Leukocytes, UA: NEGATIVE
Nitrite, UA: NEGATIVE
POC,PROTEIN,UA: NEGATIVE
Spec Grav, UA: 1.015 (ref 1.010–1.025)
Urobilinogen, UA: 0.2 E.U./dL
pH, UA: 7 (ref 5.0–8.0)

## 2019-07-22 MED ORDER — TETANUS-DIPHTH-ACELL PERTUSSIS 5-2.5-18.5 LF-MCG/0.5 IM SUSP
0.5000 mL | Freq: Once | INTRAMUSCULAR | Status: AC
  Administered 2019-07-22: 10:00:00 0.5 mL via INTRAMUSCULAR

## 2019-07-22 NOTE — Progress Notes (Signed)
ROB: Doing well, no complaints.  Discussed belly band use for pelvic discomfort. Discussed For 28 week labs today.  Desires to breastfeed, desires undecided method for contraception. For Tdap today, signed blood consent. Discussed pain management, would like to not get an epidural, but is still open to the possibility. Ready Set Baby info done.    The following were addressed during this visit:  Breastfeeding Education - Risks of giving your baby anything other than breast milk if you are breastfeeding    Comments: Make the baby less content with breastfeeds, may make my baby more susceptible to illness, and may reduce my milk supply.   - Nonpharmacological pain relief methods for labor    Comments: Deep breathing, focusing on pleasant things, movement and walking, heating pads or cold compress, massage and relaxation, continuous support from someone you trust, and Doulas   - Rooming-in on a 24-hour basis    Comments: Easier to learn baby's feeding cues, easier to bond and get to know each other, and encourages milk production.   - Feeding on demand or baby-led feeding    Comments: Helps prevent breastfeeding complications, helps bring in good milk supply, prevents under or overfeeding, and helps baby feel content and satisfied   - Frequent feeding to help assure optimal milk production    Comments: Making a full supply of milk requires frequent removal of milk from breasts, infant will eat 8-12 times in 24 hours, if separated from infant use breast massage, hand expression and/ or pumping to remove milk from breasts.   - Exclusive breastfeeding for the first 6 months    Comments: Builds a healthy milk supply and keeps it up, protects baby from sickness and disease, and breastmilk has everything your baby needs for the first 6 months.

## 2019-07-23 LAB — CBC
Hematocrit: 35.3 % (ref 34.0–46.6)
Hemoglobin: 11.8 g/dL (ref 11.1–15.9)
MCH: 32.2 pg (ref 26.6–33.0)
MCHC: 33.4 g/dL (ref 31.5–35.7)
MCV: 96 fL (ref 79–97)
Platelets: 197 10*3/uL (ref 150–450)
RBC: 3.66 x10E6/uL — ABNORMAL LOW (ref 3.77–5.28)
RDW: 13.2 % (ref 11.7–15.4)
WBC: 7.2 10*3/uL (ref 3.4–10.8)

## 2019-07-23 LAB — RPR: RPR Ser Ql: NONREACTIVE

## 2019-07-23 LAB — GLUCOSE, 1 HOUR GESTATIONAL: Gestational Diabetes Screen: 101 mg/dL (ref 65–139)

## 2019-08-05 ENCOUNTER — Encounter: Payer: Self-pay | Admitting: Obstetrics and Gynecology

## 2019-08-05 ENCOUNTER — Other Ambulatory Visit: Payer: Self-pay

## 2019-08-05 ENCOUNTER — Ambulatory Visit (INDEPENDENT_AMBULATORY_CARE_PROVIDER_SITE_OTHER): Payer: BC Managed Care – PPO | Admitting: Obstetrics and Gynecology

## 2019-08-05 VITALS — BP 99/63 | HR 82 | Wt 120.0 lb

## 2019-08-05 DIAGNOSIS — Z3A30 30 weeks gestation of pregnancy: Secondary | ICD-10-CM

## 2019-08-05 DIAGNOSIS — Z3483 Encounter for supervision of other normal pregnancy, third trimester: Secondary | ICD-10-CM

## 2019-08-05 LAB — POCT URINALYSIS DIPSTICK OB
Bilirubin, UA: NEGATIVE
Blood, UA: NEGATIVE
Glucose, UA: NEGATIVE
Ketones, UA: NEGATIVE
Leukocytes, UA: NEGATIVE
Nitrite, UA: NEGATIVE
POC,PROTEIN,UA: NEGATIVE
Spec Grav, UA: 1.01 (ref 1.010–1.025)
Urobilinogen, UA: 0.2 E.U./dL
pH, UA: 7 (ref 5.0–8.0)

## 2019-08-05 NOTE — Progress Notes (Signed)
ROB: No complaints today.  Denies contractions.  Reports daily fetal movement.  Taking vitamins as directed.  Lab results reviewed

## 2019-08-19 ENCOUNTER — Encounter: Payer: Self-pay | Admitting: Obstetrics and Gynecology

## 2019-08-19 ENCOUNTER — Other Ambulatory Visit: Payer: Self-pay

## 2019-08-19 ENCOUNTER — Ambulatory Visit (INDEPENDENT_AMBULATORY_CARE_PROVIDER_SITE_OTHER): Payer: BC Managed Care – PPO | Admitting: Obstetrics and Gynecology

## 2019-08-19 VITALS — BP 99/62 | HR 82 | Wt 122.7 lb

## 2019-08-19 DIAGNOSIS — Z3483 Encounter for supervision of other normal pregnancy, third trimester: Secondary | ICD-10-CM

## 2019-08-19 DIAGNOSIS — O1203 Gestational edema, third trimester: Secondary | ICD-10-CM

## 2019-08-19 DIAGNOSIS — Z3A32 32 weeks gestation of pregnancy: Secondary | ICD-10-CM

## 2019-08-19 LAB — POCT URINALYSIS DIPSTICK OB
Bilirubin, UA: NEGATIVE
Blood, UA: NEGATIVE
Glucose, UA: NEGATIVE
Ketones, UA: NEGATIVE
Leukocytes, UA: NEGATIVE
Nitrite, UA: NEGATIVE
Spec Grav, UA: 1.02 (ref 1.010–1.025)
Urobilinogen, UA: 0.2 E.U./dL
pH, UA: 6.5 (ref 5.0–8.0)

## 2019-08-19 NOTE — Progress Notes (Signed)
ROB: Patient notes some mild swelling in legs. Plans on using a doula for labor.    The following were addressed during this visit:  Breastfeeding Education - Effective positioning and attachment    Comments: Helps my baby to get enough breast milk, helps to produce an adequate milk supply, and helps prevent nipple pain and damage

## 2019-08-19 NOTE — Patient Instructions (Signed)
Third Trimester of Pregnancy The third trimester is from week 28 through week 40 (months 7 through 9). The third trimester is a time when the unborn baby (fetus) is growing rapidly. At the end of the ninth month, the fetus is about 20 inches in length and weighs 6-10 pounds. Body changes during your third trimester Your body will continue to go through many changes during pregnancy. The changes vary from woman to woman. During the third trimester:  Your weight will continue to increase. You can expect to gain 25-35 pounds (11-16 kg) by the end of the pregnancy.  You may begin to get stretch marks on your hips, abdomen, and breasts.  You may urinate more often because the fetus is moving lower into your pelvis and pressing on your bladder.  You may develop or continue to have heartburn. This is caused by increased hormones that slow down muscles in the digestive tract.  You may develop or continue to have constipation because increased hormones slow digestion and cause the muscles that push waste through your intestines to relax.  You may develop hemorrhoids. These are swollen veins (varicose veins) in the rectum that can itch or be painful.  You may develop swollen, bulging veins (varicose veins) in your legs.  You may have increased body aches in the pelvis, back, or thighs. This is due to weight gain and increased hormones that are relaxing your joints.  You may have changes in your hair. These can include thickening of your hair, rapid growth, and changes in texture. Some women also have hair loss during or after pregnancy, or hair that feels dry or thin. Your hair will most likely return to normal after your baby is born.  Your breasts will continue to grow and they will continue to become tender. A yellow fluid (colostrum) may leak from your breasts. This is the first milk you are producing for your baby.  Your belly button may stick out.  You may notice more swelling in your hands,  face, or ankles.  You may have increased tingling or numbness in your hands, arms, and legs. The skin on your belly may also feel numb.  You may feel short of breath because of your expanding uterus.  You may have more problems sleeping. This can be caused by the size of your belly, increased need to urinate, and an increase in your body's metabolism.  You may notice the fetus "dropping," or moving lower in your abdomen (lightening).  You may have increased vaginal discharge.  You may notice your joints feel loose and you may have pain around your pelvic bone. What to expect at prenatal visits You will have prenatal exams every 2 weeks until week 36. Then you will have weekly prenatal exams. During a routine prenatal visit:  You will be weighed to make sure you and the baby are growing normally.  Your blood pressure will be taken.  Your abdomen will be measured to track your baby's growth.  The fetal heartbeat will be listened to.  Any test results from the previous visit will be discussed.  You may have a cervical check near your due date to see if your cervix has softened or thinned (effaced).  You will be tested for Group B streptococcus. This happens between 35 and 37 weeks. Your health care provider may ask you:  What your birth plan is.  How you are feeling.  If you are feeling the baby move.  If you have had any abnormal   symptoms, such as leaking fluid, bleeding, severe headaches, or abdominal cramping.  If you are using any tobacco products, including cigarettes, chewing tobacco, and electronic cigarettes.  If you have any questions. Other tests or screenings that may be performed during your third trimester include:  Blood tests that check for low iron levels (anemia).  Fetal testing to check the health, activity level, and growth of the fetus. Testing is done if you have certain medical conditions or if there are problems during the pregnancy.  Nonstress test  (NST). This test checks the health of your baby to make sure there are no signs of problems, such as the baby not getting enough oxygen. During this test, a belt is placed around your belly. The baby is made to move, and its heart rate is monitored during movement. What is false labor? False labor is a condition in which you feel small, irregular tightenings of the muscles in the womb (contractions) that usually go away with rest, changing position, or drinking water. These are called Braxton Hicks contractions. Contractions may last for hours, days, or even weeks before true labor sets in. If contractions come at regular intervals, become more frequent, increase in intensity, or become painful, you should see your health care provider. What are the signs of labor?  Abdominal cramps.  Regular contractions that start at 10 minutes apart and become stronger and more frequent with time.  Contractions that start on the top of the uterus and spread down to the lower abdomen and back.  Increased pelvic pressure and dull back pain.  A watery or bloody mucus discharge that comes from the vagina.  Leaking of amniotic fluid. This is also known as your "water breaking." It could be a slow trickle or a gush. Let your health care provider know if it has a color or strange odor. If you have any of these signs, call your health care provider right away, even if it is before your due date. Follow these instructions at home: Medicines  Follow your health care provider's instructions regarding medicine use. Specific medicines may be either safe or unsafe to take during pregnancy.  Take a prenatal vitamin that contains at least 600 micrograms (mcg) of folic acid.  If you develop constipation, try taking a stool softener if your health care provider approves. Eating and drinking   Eat a balanced diet that includes fresh fruits and vegetables, whole grains, good sources of protein such as meat, eggs, or tofu,  and low-fat dairy. Your health care provider will help you determine the amount of weight gain that is right for you.  Avoid raw meat and uncooked cheese. These carry germs that can cause birth defects in the baby.  If you have low calcium intake from food, talk to your health care provider about whether you should take a daily calcium supplement.  Eat four or five small meals rather than three large meals a day.  Limit foods that are high in fat and processed sugars, such as fried and sweet foods.  To prevent constipation: ? Drink enough fluid to keep your urine clear or pale yellow. ? Eat foods that are high in fiber, such as fresh fruits and vegetables, whole grains, and beans. Activity  Exercise only as directed by your health care provider. Most women can continue their usual exercise routine during pregnancy. Try to exercise for 30 minutes at least 5 days a week. Stop exercising if you experience uterine contractions.  Avoid heavy lifting.  Do   not exercise in extreme heat or humidity, or at high altitudes.  Wear low-heel, comfortable shoes.  Practice good posture.  You may continue to have sex unless your health care provider tells you otherwise. Relieving pain and discomfort  Take frequent breaks and rest with your legs elevated if you have leg cramps or low back pain.  Take warm sitz baths to soothe any pain or discomfort caused by hemorrhoids. Use hemorrhoid cream if your health care provider approves.  Wear a good support bra to prevent discomfort from breast tenderness.  If you develop varicose veins: ? Wear support pantyhose or compression stockings as told by your healthcare provider. ? Elevate your feet for 15 minutes, 3-4 times a day. Prenatal care  Write down your questions. Take them to your prenatal visits.  Keep all your prenatal visits as told by your health care provider. This is important. Safety  Wear your seat belt at all times when driving.  Make  a list of emergency phone numbers, including numbers for family, friends, the hospital, and police and fire departments. General instructions  Avoid cat litter boxes and soil used by cats. These carry germs that can cause birth defects in the baby. If you have a cat, ask someone to clean the litter box for you.  Do not travel far distances unless it is absolutely necessary and only with the approval of your health care provider.  Do not use hot tubs, steam rooms, or saunas.  Do not drink alcohol.  Do not use any products that contain nicotine or tobacco, such as cigarettes and e-cigarettes. If you need help quitting, ask your health care provider.  Do not use any medicinal herbs or unprescribed drugs. These chemicals affect the formation and growth of the baby.  Do not douche or use tampons or scented sanitary pads.  Do not cross your legs for long periods of time.  To prepare for the arrival of your baby: ? Take prenatal classes to understand, practice, and ask questions about labor and delivery. ? Make a trial run to the hospital. ? Visit the hospital and tour the maternity area. ? Arrange for maternity or paternity leave through employers. ? Arrange for family and friends to take care of pets while you are in the hospital. ? Purchase a rear-facing car seat and make sure you know how to install it in your car. ? Pack your hospital bag. ? Prepare the baby's nursery. Make sure to remove all pillows and stuffed animals from the baby's crib to prevent suffocation.  Visit your dentist if you have not gone during your pregnancy. Use a soft toothbrush to brush your teeth and be gentle when you floss. Contact a health care provider if:  You are unsure if you are in labor or if your water has broken.  You become dizzy.  You have mild pelvic cramps, pelvic pressure, or nagging pain in your abdominal area.  You have lower back pain.  You have persistent nausea, vomiting, or  diarrhea.  You have an unusual or bad smelling vaginal discharge.  You have pain when you urinate. Get help right away if:  Your water breaks before 37 weeks.  You have regular contractions less than 5 minutes apart before 37 weeks.  You have a fever.  You are leaking fluid from your vagina.  You have spotting or bleeding from your vagina.  You have severe abdominal pain or cramping.  You have rapid weight loss or weight gain.  You have   shortness of breath with chest pain.  You notice sudden or extreme swelling of your face, hands, ankles, feet, or legs.  Your baby makes fewer than 10 movements in 2 hours.  You have severe headaches that do not go away when you take medicine.  You have vision changes. Summary  The third trimester is from week 28 through week 40, months 7 through 9. The third trimester is a time when the unborn baby (fetus) is growing rapidly.  During the third trimester, your discomfort may increase as you and your baby continue to gain weight. You may have abdominal, leg, and back pain, sleeping problems, and an increased need to urinate.  During the third trimester your breasts will keep growing and they will continue to become tender. A yellow fluid (colostrum) may leak from your breasts. This is the first milk you are producing for your baby.  False labor is a condition in which you feel small, irregular tightenings of the muscles in the womb (contractions) that eventually go away. These are called Braxton Hicks contractions. Contractions may last for hours, days, or even weeks before true labor sets in.  Signs of labor can include: abdominal cramps; regular contractions that start at 10 minutes apart and become stronger and more frequent with time; watery or bloody mucus discharge that comes from the vagina; increased pelvic pressure and dull back pain; and leaking of amniotic fluid. This information is not intended to replace advice given to you by your  health care provider. Make sure you discuss any questions you have with your health care provider. Document Revised: 08/14/2018 Document Reviewed: 05/29/2016 Elsevier Patient Education  2020 [employer]  

## 2019-08-19 NOTE — Progress Notes (Signed)
ROB-Pt present for routine prenatal care. Pt stated having lower abd pressure no other issues.

## 2019-09-02 ENCOUNTER — Encounter: Payer: Self-pay | Admitting: Obstetrics and Gynecology

## 2019-09-02 ENCOUNTER — Other Ambulatory Visit: Payer: Self-pay

## 2019-09-02 ENCOUNTER — Ambulatory Visit (INDEPENDENT_AMBULATORY_CARE_PROVIDER_SITE_OTHER): Payer: BC Managed Care – PPO | Admitting: Obstetrics and Gynecology

## 2019-09-02 VITALS — BP 97/63 | HR 76 | Wt 122.8 lb

## 2019-09-02 DIAGNOSIS — Z3483 Encounter for supervision of other normal pregnancy, third trimester: Secondary | ICD-10-CM

## 2019-09-02 DIAGNOSIS — Z3A34 34 weeks gestation of pregnancy: Secondary | ICD-10-CM

## 2019-09-02 LAB — POCT URINALYSIS DIPSTICK OB
Bilirubin, UA: NEGATIVE
Blood, UA: NEGATIVE
Glucose, UA: NEGATIVE
Ketones, UA: NEGATIVE
Leukocytes, UA: NEGATIVE
Nitrite, UA: NEGATIVE
POC,PROTEIN,UA: NEGATIVE
Spec Grav, UA: 1.01 (ref 1.010–1.025)
Urobilinogen, UA: 0.2 E.U./dL
pH, UA: 7 (ref 5.0–8.0)

## 2019-09-02 NOTE — Progress Notes (Signed)
Patient comes in today for ROB visit. She has no concerns today.  

## 2019-09-02 NOTE — Progress Notes (Signed)
ROB:  No complaints. Active baby. Cx's next visit.

## 2019-09-16 ENCOUNTER — Ambulatory Visit (INDEPENDENT_AMBULATORY_CARE_PROVIDER_SITE_OTHER): Payer: BC Managed Care – PPO | Admitting: Obstetrics and Gynecology

## 2019-09-16 ENCOUNTER — Other Ambulatory Visit: Payer: Self-pay

## 2019-09-16 ENCOUNTER — Encounter: Payer: Self-pay | Admitting: Obstetrics and Gynecology

## 2019-09-16 VITALS — BP 97/62 | HR 85 | Wt 125.4 lb

## 2019-09-16 DIAGNOSIS — Z3483 Encounter for supervision of other normal pregnancy, third trimester: Secondary | ICD-10-CM

## 2019-09-16 DIAGNOSIS — Z3A36 36 weeks gestation of pregnancy: Secondary | ICD-10-CM

## 2019-09-16 DIAGNOSIS — M5431 Sciatica, right side: Secondary | ICD-10-CM

## 2019-09-16 DIAGNOSIS — Z0289 Encounter for other administrative examinations: Secondary | ICD-10-CM

## 2019-09-16 LAB — POCT URINALYSIS DIPSTICK OB
Bilirubin, UA: NEGATIVE
Blood, UA: NEGATIVE
Glucose, UA: NEGATIVE
Ketones, UA: NEGATIVE
Leukocytes, UA: NEGATIVE
Nitrite, UA: NEGATIVE
Spec Grav, UA: 1.015 (ref 1.010–1.025)
Urobilinogen, UA: 0.2 E.U./dL
pH, UA: 7 (ref 5.0–8.0)

## 2019-09-16 NOTE — Patient Instructions (Addendum)
Sciatica  Sciatica is pain, numbness, weakness, or tingling along the path of the sciatic nerve. The sciatic nerve starts in the lower back and runs down the back of each leg. The nerve controls the muscles in the lower leg and in the back of the knee. It also provides feeling (sensation) to the back of the thigh, the lower leg, and the sole of the foot. Sciatica is a symptom of another medical condition that pinches or puts pressure on the sciatic nerve. Sciatica most often only affects one side of the body. Sciatica usually goes away on its own or with treatment. In some cases, sciatica may come back (recur). What are the causes? This condition is caused by pressure on the sciatic nerve or pinching of the nerve. This may be the result of:  A disk in between the bones of the spine bulging out too far (herniated disk).  Age-related changes in the spinal disks.  A pain disorder that affects a muscle in the buttock.  Extra bone growth near the sciatic nerve.  A break (fracture) of the pelvis.  Pregnancy.  Tumor. This is rare. What increases the risk? The following factors may make you more likely to develop this condition:  Playing sports that place pressure or stress on the spine.  Having poor strength and flexibility.  A history of back injury or surgery.  Sitting for long periods of time.  Doing activities that involve repetitive bending or lifting.  Obesity. What are the signs or symptoms? Symptoms can vary from mild to very severe, and they may include:  Any of these problems in the lower back, leg, hip, or buttock: ? Mild tingling, numbness, or dull aches. ? Burning sensations. ? Sharp pains.  Numbness in the back of the calf or the sole of the foot.  Leg weakness.  Severe back pain that makes movement difficult. Symptoms may get worse when you cough, sneeze, or laugh, or when you sit or stand for long periods of time. How is this diagnosed? This condition may be  diagnosed based on:  Your symptoms and medical history.  A physical exam.  Blood tests.  Imaging tests, such as: ? X-rays. ? MRI. ? CT scan. How is this treated? In many cases, this condition improves on its own without treatment. However, treatment may include:  Reducing or modifying physical activity.  Exercising and stretching.  Icing and applying heat to the affected area.  Medicines that help to: ? Relieve pain and swelling. ? Relax your muscles.  Injections of medicines that help to relieve pain, irritation, and inflammation around the sciatic nerve (steroids).  Surgery. Follow these instructions at home: Medicines  Take over-the-counter and prescription medicines only as told by your health care provider.  Ask your health care provider if the medicine prescribed to you: ? Requires you to avoid driving or using heavy machinery. ? Can cause constipation. You may need to take these actions to prevent or treat constipation:  Drink enough fluid to keep your urine pale yellow.  Take over-the-counter or prescription medicines.  Eat foods that are high in fiber, such as beans, whole grains, and fresh fruits and vegetables.  Limit foods that are high in fat and processed sugars, such as fried or sweet foods. Managing pain      If directed, put ice on the affected area. ? Put ice in a plastic bag. ? Place a towel between your skin and the bag. ? Leave the ice on for 20 minutes,   2-3 times a day.  If directed, apply heat to the affected area. Use the heat source that your health care provider recommends, such as a moist heat pack or a heating pad. ? Place a towel between your skin and the heat source. ? Leave the heat on for 20-30 minutes. ? Remove the heat if your skin turns bright red. This is especially important if you are unable to feel pain, heat, or cold. You may have a greater risk of getting burned. Activity   Return to your normal activities as told  by your health care provider. Ask your health care provider what activities are safe for you.  Avoid activities that make your symptoms worse.  Take brief periods of rest throughout the day. ? When you rest for longer periods, mix in some mild activity or stretching between periods of rest. This will help to prevent stiffness and pain. ? Avoid sitting for long periods of time without moving. Get up and move around at least one time each hour.  Exercise and stretch regularly, as told by your health care provider.  Do not lift anything that is heavier than 10 lb (4.5 kg) while you have symptoms of sciatica. When you do not have symptoms, you should still avoid heavy lifting, especially repetitive heavy lifting.  When you lift objects, always use proper lifting technique, which includes: ? Bending your knees. ? Keeping the load close to your body. ? Avoiding twisting. General instructions  Maintain a healthy weight. Excess weight puts extra stress on your back.  Wear supportive, comfortable shoes. Avoid wearing high heels.  Avoid sleeping on a mattress that is too soft or too hard. A mattress that is firm enough to support your back when you sleep may help to reduce your pain.  Keep all follow-up visits as told by your health care provider. This is important. Contact a health care provider if:  You have pain that: ? Wakes you up when you are sleeping. ? Gets worse when you lie down. ? Is worse than you have experienced in the past. ? Lasts longer than 4 weeks.  You have an unexplained weight loss. Get help right away if:  You are not able to control when you urinate or have bowel movements (incontinence).  You have: ? Weakness in your lower back, pelvis, buttocks, or legs that gets worse. ? Redness or swelling of your back. ? A burning sensation when you urinate. Summary  Sciatica is pain, numbness, weakness, or tingling along the path of the sciatic nerve.  This condition  is caused by pressure on the sciatic nerve or pinching of the nerve.  Sciatica can cause pain, numbness, or tingling in the lower back, legs, hips, and buttocks.  Treatment often includes rest, exercise, medicines, and applying ice or heat. This information is not intended to replace advice given to you by your health care provider. Make sure you discuss any questions you have with your health care provider. Document Revised: 05/12/2018 Document Reviewed: 05/12/2018 Elsevier Patient Education  [employer].    Signs and Symptoms of Labor Labor is your body's natural process of moving your baby, placenta, and umbilical cord out of your uterus. The process of labor usually starts when your baby is full-term, between 13 and 40 weeks of pregnancy. How will I know when I am close to going into labor? As your body prepares for labor and the birth of your baby, you may notice the following symptoms in the weeks  and days before true labor starts:  Having a strong desire to get your home ready to receive your new baby. This is called nesting. Nesting may be a sign that labor is approaching, and it may occur several weeks before birth. Nesting may involve cleaning and organizing your home.  Passing a small amount of thick, bloody mucus out of your vagina (normal bloody show or losing your mucus plug). This may happen more than a week before labor begins, or it might occur right before labor begins as the opening of the cervix starts to widen (dilate). For some women, the entire mucus plug passes at once. For others, smaller portions of the mucus plug may gradually pass over several days.  Your baby moving (dropping) lower in your pelvis to get into position for birth (lightening). When this happens, you may feel more pressure on your bladder and pelvic bone and less pressure on your ribs. This may make it easier to breathe. It may also cause you to need to urinate more often and have problems with  bowel movements.  Having "practice contractions" (Braxton Hicks contractions) that occur at irregular (unevenly spaced) intervals that are more than 10 minutes apart. This is also called false labor. False labor contractions are common after exercise or sexual activity, and they will stop if you change position, rest, or drink fluids. These contractions are usually mild and do not get stronger over time. They may feel like: ? A backache or back pain. ? Mild cramps, similar to menstrual cramps. ? Tightening or pressure in your abdomen. Other early symptoms that labor may be starting soon include:  Nausea or loss of appetite.  Diarrhea.  Having a sudden burst of energy, or feeling very tired.  Mood changes.  Having trouble sleeping. How will I know when labor has begun? Signs that true labor has begun may include:  Having contractions that come at regular (evenly spaced) intervals and increase in intensity. This may feel like more intense tightening or pressure in your abdomen that moves to your back. ? Contractions may also feel like rhythmic pain in your upper thighs or back that comes and goes at regular intervals. ? For first-time mothers, this change in intensity of contractions often occurs at a more gradual pace. ? Women who have given birth before may notice a more rapid progression of contraction changes.  Having a feeling of pressure in the vaginal area.  Your water breaking (rupture of membranes). This is when the sac of fluid that surrounds your baby breaks. When this happens, you will notice fluid leaking from your vagina. This may be clear or blood-tinged. Labor usually starts within 24 hours of your water breaking, but it may take longer to begin. ? Some women notice this as a gush of fluid. ? Others notice that their underwear repeatedly becomes damp. Follow these instructions at home:   When labor starts, or if your water breaks, call your health care provider or nurse  care line. Based on your situation, they will determine when you should go in for an exam.  When you are in early labor, you may be able to rest and manage symptoms at home. Some strategies to try at home include: ? Breathing and relaxation techniques. ? Taking a warm bath or shower. ? Listening to music. ? Using a heating pad on the lower back for pain. If you are directed to use heat:  Place a towel between your skin and the heat source.  Leave  the heat on for 20-30 minutes.  Remove the heat if your skin turns bright red. This is especially important if you are unable to feel pain, heat, or cold. You may have a greater risk of getting burned. Get help right away if:  You have painful, regular contractions that are 5 minutes apart or less.  Labor starts before you are [redacted] weeks along in your pregnancy.  You have a fever.  You have a headache that does not go away.  You have bright red blood coming from your vagina.  You do not feel your baby moving.  You have a sudden onset of: ? Severe headache with vision problems. ? Nausea, vomiting, or diarrhea. ? Chest pain or shortness of breath. These symptoms may be an emergency. If your health care provider recommends that you go to the hospital or birth center where you plan to deliver, do not drive yourself. Have someone else drive you, or call emergency services (911 in the [place] Summary  Labor is your body's natural process of moving your baby, placenta, and umbilical cord out of your uterus.  The process of labor usually starts when your baby is full-term, between 26 and 40 weeks of pregnancy.  When labor starts, or if your water breaks, call your health care provider or nurse care line. Based on your situation, they will determine when you should go in for an exam. This information is not intended to replace advice given to you by your health care provider. Make sure you discuss any questions you have with your health care  provider. Document Revised: 01/21/2017 Document Reviewed: 09/28/2016 Elsevier Patient Education  2020 [employer].

## 2019-09-16 NOTE — Progress Notes (Signed)
ROB: Notes some pelvic pressure, occasional feet swelling (usually after prolonged standing all day), sciatic pain. 36 week labs done today. RTC in 1 week. Labor precautions given.

## 2019-09-16 NOTE — Progress Notes (Signed)
ROB-Pt present for routine prenatal care and 36 weeks labs. Pt c/o vaginal pain and pressure, leg/butt pain and swelling in the feet.

## 2019-09-18 LAB — STREP GP B NAA: Strep Gp B NAA: NEGATIVE

## 2019-09-19 LAB — GC/CHLAMYDIA PROBE AMP
Chlamydia trachomatis, NAA: NEGATIVE
Neisseria Gonorrhoeae by PCR: NEGATIVE

## 2019-09-23 ENCOUNTER — Encounter: Payer: Self-pay | Admitting: Obstetrics and Gynecology

## 2019-09-23 ENCOUNTER — Other Ambulatory Visit: Payer: Self-pay

## 2019-09-23 ENCOUNTER — Ambulatory Visit (INDEPENDENT_AMBULATORY_CARE_PROVIDER_SITE_OTHER): Payer: BC Managed Care – PPO | Admitting: Obstetrics and Gynecology

## 2019-09-23 VITALS — BP 93/64 | HR 114 | Wt 122.8 lb

## 2019-09-23 DIAGNOSIS — Z3A37 37 weeks gestation of pregnancy: Secondary | ICD-10-CM

## 2019-09-23 DIAGNOSIS — Z3483 Encounter for supervision of other normal pregnancy, third trimester: Secondary | ICD-10-CM

## 2019-09-23 LAB — POCT URINALYSIS DIPSTICK OB
Bilirubin, UA: NEGATIVE
Blood, UA: NEGATIVE
Glucose, UA: NEGATIVE
Ketones, UA: NEGATIVE
Leukocytes, UA: NEGATIVE
Nitrite, UA: NEGATIVE
Spec Grav, UA: 1.015 (ref 1.010–1.025)
Urobilinogen, UA: 0.2 E.U./dL
pH, UA: 7 (ref 5.0–8.0)

## 2019-09-23 NOTE — Progress Notes (Signed)
ROB:  " Tired" -patient states she has occasional contractions.  Signs and symptoms of labor reviewed.  Cervix soft but no dilation or effacement.

## 2019-09-30 ENCOUNTER — Other Ambulatory Visit: Payer: Self-pay

## 2019-09-30 ENCOUNTER — Ambulatory Visit (INDEPENDENT_AMBULATORY_CARE_PROVIDER_SITE_OTHER): Payer: BC Managed Care – PPO | Admitting: Obstetrics and Gynecology

## 2019-09-30 ENCOUNTER — Encounter: Payer: Self-pay | Admitting: Obstetrics and Gynecology

## 2019-09-30 VITALS — BP 100/63 | HR 94 | Wt 124.0 lb

## 2019-09-30 DIAGNOSIS — Z3483 Encounter for supervision of other normal pregnancy, third trimester: Secondary | ICD-10-CM

## 2019-09-30 DIAGNOSIS — Z3A38 38 weeks gestation of pregnancy: Secondary | ICD-10-CM

## 2019-09-30 LAB — POCT URINALYSIS DIPSTICK OB
Bilirubin, UA: NEGATIVE
Blood, UA: NEGATIVE
Glucose, UA: NEGATIVE
Ketones, UA: NEGATIVE
Leukocytes, UA: NEGATIVE
Nitrite, UA: NEGATIVE
Spec Grav, UA: 1.02 (ref 1.010–1.025)
Urobilinogen, UA: 0.2 E.U./dL
pH, UA: 6 (ref 5.0–8.0)

## 2019-09-30 NOTE — Progress Notes (Signed)
ROB: Doing well except noting pelvic pressure. Unable to determine fetal presentation, will order ultrasound. Discussed labor precautions and answered questions about birth plan. RTC in 1 week.

## 2019-09-30 NOTE — Progress Notes (Signed)
ROB-Pt present for routine prenatal care. Pt c/o of lower abd pressure. pt denies any other issues at this time.

## 2019-10-01 ENCOUNTER — Encounter: Payer: Self-pay | Admitting: Obstetrics and Gynecology

## 2019-10-01 ENCOUNTER — Ambulatory Visit (INDEPENDENT_AMBULATORY_CARE_PROVIDER_SITE_OTHER): Payer: BC Managed Care – PPO | Admitting: Obstetrics and Gynecology

## 2019-10-01 DIAGNOSIS — Z3483 Encounter for supervision of other normal pregnancy, third trimester: Secondary | ICD-10-CM

## 2019-10-01 DIAGNOSIS — Z3A38 38 weeks gestation of pregnancy: Secondary | ICD-10-CM

## 2019-10-01 NOTE — Progress Notes (Signed)
ROB: Patient returns for ultrasound for presentation.  Bedside sono perform, noted vertex presentation.  RTC for next scheduled appointment.

## 2019-10-04 ENCOUNTER — Inpatient Hospital Stay: Payer: BC Managed Care – PPO | Admitting: Registered Nurse

## 2019-10-04 ENCOUNTER — Encounter: Payer: Self-pay | Admitting: Obstetrics and Gynecology

## 2019-10-04 ENCOUNTER — Other Ambulatory Visit: Payer: Self-pay

## 2019-10-04 ENCOUNTER — Inpatient Hospital Stay
Admission: EM | Admit: 2019-10-04 | Discharge: 2019-10-06 | DRG: 807 | Disposition: A | Discharge status: Home / Self Care | Payer: BC Managed Care – PPO | Attending: Obstetrics and Gynecology | Admitting: Obstetrics and Gynecology

## 2019-10-04 DIAGNOSIS — Z3A39 39 weeks gestation of pregnancy: Secondary | ICD-10-CM | POA: Diagnosis not present

## 2019-10-04 DIAGNOSIS — Z20822 Contact with and (suspected) exposure to covid-19: Secondary | ICD-10-CM | POA: Diagnosis present

## 2019-10-04 DIAGNOSIS — Z3490 Encounter for supervision of normal pregnancy, unspecified, unspecified trimester: Secondary | ICD-10-CM

## 2019-10-04 DIAGNOSIS — O26893 Other specified pregnancy related conditions, third trimester: Secondary | ICD-10-CM | POA: Diagnosis present

## 2019-10-04 LAB — CBC
HCT: 40.4 % (ref 36.0–46.0)
Hemoglobin: 14 g/dL (ref 12.0–15.0)
MCH: 32 pg (ref 26.0–34.0)
MCHC: 34.7 g/dL (ref 30.0–36.0)
MCV: 92.4 fL (ref 80.0–100.0)
Platelets: 214 10*3/uL (ref 150–400)
RBC: 4.37 MIL/uL (ref 3.87–5.11)
RDW: 14.3 % (ref 11.5–15.5)
WBC: 9 10*3/uL (ref 4.0–10.5)
nRBC: 0 % (ref 0.0–0.2)

## 2019-10-04 LAB — TYPE AND SCREEN
ABO/RH(D): B POS
Antibody Screen: NEGATIVE

## 2019-10-04 LAB — SARS CORONAVIRUS 2 BY RT PCR (HOSPITAL ORDER, PERFORMED IN ~~LOC~~ HOSPITAL LAB): SARS Coronavirus 2: NEGATIVE

## 2019-10-04 LAB — ABO/RH: ABO/RH(D): B POS

## 2019-10-04 LAB — RPR: RPR Ser Ql: NONREACTIVE

## 2019-10-04 MED ORDER — SODIUM CHLORIDE 0.9 % IV SOLN
INTRAVENOUS | Status: DC | PRN
  Administered 2019-10-04: 4 mL via EPIDURAL
  Administered 2019-10-04: 3 mL via EPIDURAL
  Administered 2019-10-04: 4 mL via EPIDURAL

## 2019-10-04 MED ORDER — LIDOCAINE HCL (PF) 1 % IJ SOLN
30.0000 mL | INTRAMUSCULAR | Status: DC | PRN
  Filled 2019-10-04: qty 30

## 2019-10-04 MED ORDER — OXYTOCIN 10 UNIT/ML IJ SOLN
INTRAMUSCULAR | Status: AC
  Filled 2019-10-04: qty 2

## 2019-10-04 MED ORDER — OXYTOCIN 40 UNITS IN NORMAL SALINE INFUSION - SIMPLE MED
1.0000 m[IU]/min | INTRAVENOUS | Status: DC
  Administered 2019-10-04: 2 m[IU]/min via INTRAVENOUS

## 2019-10-04 MED ORDER — IBUPROFEN 600 MG PO TABS
600.0000 mg | ORAL_TABLET | Freq: Four times a day (QID) | ORAL | Status: DC
  Administered 2019-10-04: 600 mg via ORAL
  Filled 2019-10-04: qty 1

## 2019-10-04 MED ORDER — FENTANYL 2.5 MCG/ML W/ROPIVACAINE 0.15% IN NS 100 ML EPIDURAL (ARMC)
EPIDURAL | Status: DC | PRN
  Administered 2019-10-04: 12 mL/h via EPIDURAL

## 2019-10-04 MED ORDER — ZOLPIDEM TARTRATE 5 MG PO TABS: 5.0000 mg | ORAL_TABLET | Freq: Every evening | ORAL | Status: DC | PRN

## 2019-10-04 MED ORDER — SOD CITRATE-CITRIC ACID 500-334 MG/5ML PO SOLN: 30.0000 mL | ORAL | Status: DC | PRN

## 2019-10-04 MED ORDER — MISOPROSTOL 200 MCG PO TABS
ORAL_TABLET | ORAL | Status: AC
  Filled 2019-10-04: qty 4

## 2019-10-04 MED ORDER — DIPHENHYDRAMINE HCL 25 MG PO CAPS: 25.0000 mg | ORAL_CAPSULE | Freq: Four times a day (QID) | ORAL | Status: DC | PRN

## 2019-10-04 MED ORDER — LIDOCAINE HCL (PF) 1 % IJ SOLN
INTRAMUSCULAR | Status: DC | PRN
  Administered 2019-10-04: 3 mL

## 2019-10-04 MED ORDER — AMMONIA AROMATIC IN INHA
RESPIRATORY_TRACT | Status: AC
  Filled 2019-10-04: qty 10

## 2019-10-04 MED ORDER — LACTATED RINGERS IV SOLN: INTRAVENOUS | Status: DC

## 2019-10-04 MED ORDER — TERBUTALINE SULFATE 1 MG/ML IJ SOLN: 0.2500 mg | Freq: Once | INTRAMUSCULAR | Status: DC | PRN

## 2019-10-04 MED ORDER — ACETAMINOPHEN 325 MG PO TABS
650.0000 mg | ORAL_TABLET | ORAL | Status: DC | PRN
  Administered 2019-10-05: 650 mg via ORAL

## 2019-10-04 MED ORDER — OXYCODONE-ACETAMINOPHEN 5-325 MG PO TABS: 1.0000 | ORAL_TABLET | ORAL | Status: DC | PRN

## 2019-10-04 MED ORDER — OXYTOCIN 40 UNITS IN NORMAL SALINE INFUSION - SIMPLE MED
2.5000 [IU]/h | INTRAVENOUS | Status: DC | PRN
  Administered 2019-10-04: 2.5 [IU]/h via INTRAVENOUS
  Filled 2019-10-04: qty 1000

## 2019-10-04 MED ORDER — SIMETHICONE 80 MG PO CHEW: 80.0000 mg | CHEWABLE_TABLET | ORAL | Status: DC | PRN

## 2019-10-04 MED ORDER — OXYCODONE-ACETAMINOPHEN 5-325 MG PO TABS: 2.0000 | ORAL_TABLET | ORAL | Status: DC | PRN

## 2019-10-04 MED ORDER — FENTANYL 2.5 MCG/ML W/ROPIVACAINE 0.15% IN NS 100 ML EPIDURAL (ARMC)
EPIDURAL | Status: AC
  Filled 2019-10-04: qty 100

## 2019-10-04 MED ORDER — OXYTOCIN 40 UNITS IN NORMAL SALINE INFUSION - SIMPLE MED
2.5000 [IU]/h | INTRAVENOUS | Status: DC
  Filled 2019-10-04: qty 1000

## 2019-10-04 MED ORDER — ACETAMINOPHEN 325 MG PO TABS
650.0000 mg | ORAL_TABLET | ORAL | Status: DC | PRN
  Filled 2019-10-04: qty 2

## 2019-10-04 MED ORDER — PRENATAL MULTIVITAMIN CH
1.0000 | ORAL_TABLET | Freq: Every day | ORAL | Status: DC
  Filled 2019-10-04: qty 1

## 2019-10-04 MED ORDER — OXYTOCIN BOLUS FROM INFUSION
500.0000 mL | Freq: Once | INTRAVENOUS | Status: AC
  Administered 2019-10-04: 500 mL via INTRAVENOUS

## 2019-10-04 MED ORDER — ONDANSETRON HCL 4 MG/2ML IJ SOLN: 4.0000 mg | Freq: Four times a day (QID) | INTRAMUSCULAR | Status: DC | PRN

## 2019-10-04 MED ORDER — BENZOCAINE-MENTHOL 20-0.5 % EX AERO
1.0000 "application " | INHALATION_SPRAY | CUTANEOUS | Status: DC | PRN
  Administered 2019-10-04: 1 via TOPICAL
  Filled 2019-10-04: qty 56

## 2019-10-04 MED ORDER — DOCUSATE SODIUM 100 MG PO CAPS
100.0000 mg | ORAL_CAPSULE | Freq: Two times a day (BID) | ORAL | Status: DC
  Administered 2019-10-04 – 2019-10-05 (×3): 100 mg via ORAL
  Filled 2019-10-04 (×3): qty 1

## 2019-10-04 MED ORDER — IBUPROFEN 600 MG PO TABS
600.0000 mg | ORAL_TABLET | Freq: Four times a day (QID) | ORAL | Status: DC
  Administered 2019-10-04 – 2019-10-06 (×6): 600 mg via ORAL
  Filled 2019-10-04 (×6): qty 1

## 2019-10-04 MED ORDER — TETANUS-DIPHTH-ACELL PERTUSSIS 5-2.5-18.5 LF-MCG/0.5 IM SUSP: 0.5000 mL | Freq: Once | INTRAMUSCULAR | Status: DC

## 2019-10-04 MED ORDER — LACTATED RINGERS IV SOLN
500.0000 mL | INTRAVENOUS | Status: DC | PRN
  Administered 2019-10-04: 1000 mL via INTRAVENOUS
  Administered 2019-10-04: 500 mL via INTRAVENOUS

## 2019-10-04 NOTE — Progress Notes (Signed)
MD at bedside for delivery.  1010- Vacuum applied 1011- pop off, vacuum applied 1012 pop off  Time of Delivery 1013

## 2019-10-04 NOTE — Anesthesia Procedure Notes (Signed)
Epidural  Start time: 10/04/2019 8:34 AM End time: 10/04/2019 8:55 AM  Staffing Anesthesiologist: Naomie Dean, MD Resident/CRNA: Lynden [place], CRNA Performed: resident/CRNA   Preanesthetic Checklist Completed: patient identified, IV checked, risks and benefits discussed, monitors and equipment checked and pre-op evaluation  Epidural Patient position: sitting Prep: ChloraPrep Patient monitoring: heart rate, cardiac monitor, continuous pulse ox and blood pressure Approach: midline Location: L3-L4 Injection technique: LOR air  Needle:  Needle type: Tuohy  Needle gauge: 17 G Needle length: 9 cm Needle insertion depth: 7 cm Catheter type: closed end flexible Catheter size: 19 Gauge Catheter at skin depth: 13 cm Test dose: negative and Other (.125% bupi)  Additional Notes Reason for block:procedure for pain

## 2019-10-04 NOTE — Progress Notes (Signed)
Pt. Sitting on side of bed for epidural procedure. FHT 145. RN unable to keep baby on monitor during procedure, at bedside.

## 2019-10-04 NOTE — Progress Notes (Signed)
RN to notify provider of recurrent decelerations despite interventions. Request to review fetal heart strip. Provider made aware. Will continue to monitor.

## 2019-10-04 NOTE — Anesthesia Preprocedure Evaluation (Signed)
Anesthesia Evaluation  Patient identified by MRN, date of birth, ID band Patient awake    Reviewed: Allergy & Precautions, H&P , NPO status , Patient's Chart, lab work & pertinent test results  Airway Mallampati: III  TM Distance: >3 FB Neck ROM: full    Dental no notable dental hx.    Pulmonary    Pulmonary exam normal        Cardiovascular (-) hypertensionNormal cardiovascular exam     Neuro/Psych  Headaches, negative psych ROS   GI/Hepatic negative GI ROS, Neg liver ROS,   Endo/Other  negative endocrine ROS  Renal/GU negative Renal ROS  negative genitourinary   Musculoskeletal   Abdominal   Peds  Hematology negative hematology ROS (+)   Anesthesia Other Findings   Reproductive/Obstetrics (+) Pregnancy                             Anesthesia Physical Anesthesia Plan  ASA: II  Anesthesia Plan: Epidural   Post-op Pain Management:    Induction:   PONV Risk Score and Plan:   Airway Management Planned:   Additional Equipment:   Intra-op Plan:   Post-operative Plan:   Informed Consent:   Plan Discussed with:   Anesthesia Plan Comments:         Anesthesia Quick Evaluation

## 2019-10-04 NOTE — OB Triage Note (Signed)
Pt presents to L&D c/o LOF that started at 12:15 am. Clear in color. Pt denies any ctx, or bleeding. Reports positive fetal movement. Vitals WNL. Will continue to monitor.

## 2019-10-04 NOTE — Plan of Care (Signed)
Alert and oriented with pleasant affect. Color good, skin w&d. VS ans assessment WNL. Fundus is Firm at U/E with small amt. Of Lochia. Pt. Oriented to room, POC and V/O. Skin to Skin with Infant at this time and S. Williams RN,LC assisting Pt. With Breast Feeding using Nipple Shield. Pt. Denies c/o.

## 2019-10-04 NOTE — H&P (Signed)
History and Physical   HPI  Abigail Johnston is a 34 y.o. G2P0010 at [redacted]w[redacted]d Estimated Date of Delivery: 10/09/19 who is being admitted for SROM.    OB History  OB History  Gravida Para Term Preterm AB Living  2 0 0 0 1 0  SAB TAB Ectopic Multiple Live Births  0 0 0 0 0    # Outcome Date GA Lbr Len/2nd Weight Sex Delivery Anes PTL Lv  2 Current           1 AB 2012            PROBLEM LIST  Pregnancy complications or risks: Patient Active Problem List   Diagnosis Date Noted  . Pregnancy 10/04/2019  . Vitamin D deficiency 06/08/2016  . Idiopathic hypotension 06/08/2016    Prenatal labs and studies: ABO, Rh: --/--/B POS Performed at [facility], [address] [place], [place] [address]  [phone]) Antibody: NEG (05/30 0227) Rubella: 6.15 (11/06 1548) RPR: Non Reactive (03/17 0841)  HBsAg: Negative (11/06 1548)  HIV: Non Reactive (11/06 1548)  AVW:PVXYIAXK/-- (05/12 5537)   Past Medical History:  Diagnosis Date  . Headache   . Migraine headache with aura      Past Surgical History:  Procedure Laterality Date  . WISDOM TOOTH EXTRACTION       Medications    Current Discharge Medication List    CONTINUE these medications which have NOT CHANGED   Details  Prenatal Vit-Fe Fumarate-FA (PRENATAL MULTIVITAMIN) TABS tablet Take 1 tablet by mouth daily at 12 noon.         Allergies  Patient has no known allergies.  Review of Systems  Pertinent items noted in HPI and remainder of comprehensive ROS otherwise negative.  Physical Exam  BP (!) 103/58   Pulse 86   Temp 98.1 F (36.7 C) (Oral)   Resp 18   Ht 4' 11.5" (1.511 m)   Wt 56.2 kg   LMP 12/26/2018   SpO2 95%   BMI 24.63 kg/m   Lungs:  CTA B Cardio: RRR without M/R/G Abd: Soft, gravid, NT Presentation: cephalic EXT: No C/C/ 1+ Edema DTRs: 2+ B CERVIX: Dilation: 3.5 Effacement (%): 80 Cervical Position: Middle Station: -2 Presentation: Vertex Exam by:: Stark Klein  RN  See Prenatal records for more detailed PE.     FHR:  Variability: Good {> 6 bpm)  Toco: Uterine Contractions: Irreg on admission - Pitocin started at @5AM   Test Results  Results for orders placed or performed during the hospital encounter of 10/04/19 (from the past 24 hour(s))  CBC     Status: None   Collection Time: 10/04/19  2:27 AM  Result Value Ref Range   WBC 9.0 4.0 - 10.5 K/uL   RBC 4.37 3.87 - 5.11 MIL/uL   Hemoglobin 14.0 12.0 - 15.0 g/dL   HCT 40.4 36.0 - 46.0 %   MCV 92.4 80.0 - 100.0 fL   MCH 32.0 26.0 - 34.0 pg   MCHC 34.7 30.0 - 36.0 g/dL   RDW 14.3 11.5 - 15.5 %   Platelets 214 150 - 400 K/uL   nRBC 0.0 0.0 - 0.2 %  Type and screen [place]     Status: None   Collection Time: 10/04/19  2:27 AM  Result Value Ref Range   ABO/RH(D) B POS    Antibody Screen NEG    Sample Expiration      10/07/2019,2359 Performed at [facility], [address] [place], [place] [address]   SARS Coronavirus 2 by RT PCR (hospital order, performed in [facility] hospital lab) Nasopharyngeal Nasopharyngeal Swab     Status: None   Collection Time: 10/04/19  2:27 AM   Specimen: Nasopharyngeal Swab  Result Value Ref Range   SARS Coronavirus 2 NEGATIVE NEGATIVE  ABO/Rh     Status: None   Collection Time: 10/04/19  7:54 AM  Result Value Ref Range   ABO/RH(D)      B POS Performed at [facility], [address] [place], [place] [address]    Group B Strep negative  Assessment   G2P0010 at [redacted]w[redacted]d Estimated Date of Delivery: 10/09/19  The fetus is reassuring.   Patient Active Problem List   Diagnosis Date Noted  . Pregnancy 10/04/2019  . Vitamin D deficiency 06/08/2016  . Idiopathic hypotension 06/08/2016    Plan  1. Admit to L&D :   2. EFM: -- Category 1 3. Stadol or Epidural if desired.   4. Admission labs  5. Expect vaginal delivery  Elonda Husky, M.D. 10/04/2019 9:24 AM

## 2019-10-05 NOTE — Progress Notes (Signed)
Patient ID: Abigail Johnston, female   DOB: 29-Mar-1986, 34 y.o.   MRN: [id number]  Progress Note - Vaginal Delivery  Abigail Johnston is a 34 y.o. G2P1011 now PP day 1 s/p Vaginal, Vacuum (Extractor) .   Subjective:  The patient reports no complaints, up ad lib, voiding and tolerating PO Working on breastfeeding.  Objective:  Vital signs in last 24 hours: Temp:  [97.6 F (36.4 C)-98.7 F (37.1 C)] 98.1 F (36.7 C) (05/31 0343) Pulse Rate:  [65-93] 80 (05/31 0752) Resp:  [16-20] 18 (05/31 0752) BP: (90-134)/(56-108) 96/56 (05/31 0752) SpO2:  [95 %-100 %] 97 % (05/31 0752)  Physical Exam:  General: alert and cooperative Lochia: appropriate Uterine Fundus: firm    Data Review Recent Labs    10/04/19 0227  HGB 14.0  HCT 40.4    Assessment/Plan: Active Problems:   Pregnancy   Plan for discharge tomorrow and Breastfeeding Lactation to see today  -- Continue routine PP care.     Elonda Husky, M.D. 10/05/2019 8:32 AM

## 2019-10-05 NOTE — Anesthesia Postprocedure Evaluation (Signed)
Anesthesia Post Note  Patient: Abigail Johnston  Procedure(s) Performed: AN AD HOC LABOR EPIDURAL  Patient location during evaluation: Mother Baby Anesthesia Type: Epidural Level of consciousness: awake and alert Pain management: pain level controlled Vital Signs Assessment: post-procedure vital signs reviewed and stable Respiratory status: spontaneous breathing, nonlabored ventilation and respiratory function stable Cardiovascular status: stable Postop Assessment: no headache, no backache and epidural receding Anesthetic complications: no     Last Vitals:  Vitals:   10/05/19 0343 10/05/19 0752  BP: (!) 98/58 (!) 96/56  Pulse: 86 80  Resp: 20 18  Temp: 36.7 C   SpO2: 100% 97%    Last Pain:  Vitals:   10/05/19 1100  TempSrc:   PainSc: 4                  Karleen Hampshire

## 2019-10-05 NOTE — Lactation Note (Addendum)
This note was copied from a baby's chart. Lactation Consultation Note  Patient Name: Abigail Johnston Date: 10/05/2019   Mom is breast feeding using #20 nipple shield with each breast feed. Nipple shield was given shortly after delivery, because could not get Abigail Johnston to stay on the breast.  She had a disorganized suck with her barely latching to the breast with pierced lips and then slipping off and fussing.  As soon as she would come off the breast, she would start rooting, but could never maintain the latch. Abigail Johnston has preferred the right breast, but breast fed well on left breast for this feeding.  Because she has been feeding longer and more often on right breast, it is getting tender.  Discussed hand expressing colostrum after breast feed and rubbing on nipples to decrease bacteria, lubricate and help with tenderness.  Mom has her own coconut oil that she is using.  Declines comfort gels for now.  She has a tendency to tongue thrust especially towards the end of the feeding pushing the nipple shield off of the breast.  #16 nipple shield given to try at next breast feed to see if it would decrease some of the tongue thrusting.  Mom has been pumping as well and giving what she expresses via syringe.  Reviewed breast massage, hand expression, pumping, collection, storage, cleaning and handling of expressed milk.  Mom has Pump In Style with Maxflow DEBP through [employer] for home use.  Explained feeding cues and encouraged to put Abigail Johnston to the breast whenever she demonstrated hunger cues.  Reviewed normal newborn stomach size, adequate out put, normal course of lactation, supply and demand and routine newborn feeding patterns.  Lactation Government social research officer given and reviewed and lactation name and number written on white board encouraging mom to call with any questions, concerns or assistance in hospital and after discharge.     Maternal Data    Feeding    LATCH Score                    Interventions    Lactation Tools Discussed/Used     Consult Status      Louis Meckel 10/05/2019, 4:47 PM

## 2019-10-06 NOTE — Progress Notes (Signed)
Patient discharged home with infant. Discharge instructions, prescriptions and follow up appointment given to and reviewed with patient. Patient verbalized understanding. Patient wheeled out with infant by auxiliary.  

## 2019-10-06 NOTE — Discharge Summary (Signed)
Patient Name: Abigail Johnston DOB: Mar 10, 1986 MRN: [id number]                            Discharge Summary  Date of Admission: 10/04/2019 Date of Discharge: 10/06/2019 Delivering Provider: Linzie Collin   Admitting Diagnosis: Pregnancy [Z34.90] at [redacted]w[redacted]d Secondary diagnosis:  Active Problems:   Pregnancy  Mode of Delivery: vacuum-assisted vaginal delivery              Discharge diagnosis: Term Pregnancy Delivered      Intrapartum Procedures: epidural, laceration labial, pitocin augmentation and placement of fetal scalp electrode   Post partum procedures:   Complications: none                     Discharge Day SOAP Note:  Progress Note - Vaginal Delivery  Abigail Johnston is a 34 y.o. G2P1011 now PP day 2 s/p Vaginal, Vacuum (Extractor) . Delivery was complicated by cat 2 fm  Subjective  The patient has the following complaints: has no unusual complaints  Pain is controlled with current medications.   Patient is urinating without difficulty.  She is ambulating well.    Objective  Vital signs: BP 108/61   Pulse 72   Temp 98.3 F (36.8 C) (Oral)   Resp 20   Ht 4' 11.5" (1.511 m)   Wt 56.2 kg   LMP 12/26/2018   SpO2 97%   Breastfeeding Unknown   BMI 24.63 kg/m   Physical Exam: Gen: NAD Fundus Fundal Tone: Firm  Lochia Amount: Small        Data Review Labs: Lab Results  Component Value Date   WBC 9.0 10/04/2019   HGB 14.0 10/04/2019   HCT 40.4 10/04/2019   MCV 92.4 10/04/2019   PLT 214 10/04/2019   CBC Latest Ref Rng & Units 10/04/2019 07/22/2019 05/09/2017  WBC 4.0 - 10.5 K/uL 9.0 7.2 5.2  Hemoglobin 12.0 - 15.0 g/dL 40.9 81.1 91.4  Hematocrit 36.0 - 46.0 % 40.4 35.3 41.1  Platelets 150 - 400 K/uL 214 197 274   B POS Performed at [facility], [address] [place], [place] [address]   Inocente Salles Score: Inocente Salles Postnatal Depression Scale Screening Tool 10/05/2019  I have been able to laugh and see the funny side of things. 0   I have looked forward with enjoyment to things. 0  I have blamed myself unnecessarily when things went wrong. 0  I have been anxious or worried for no good reason. 1  I have felt scared or panicky for no good reason. 0  Things have been getting on top of me. 0  I have been so unhappy that I have had difficulty sleeping. 0  I have felt sad or miserable. 0  I have been so unhappy that I have been crying. 0  The thought of harming myself has occurred to me. 0  Edinburgh Postnatal Depression Scale Total 1    Assessment/Plan  Active Problems:   Pregnancy    Plan for discharge today.  Discharge Instructions: Per After Visit Summary. Activity: Advance as tolerated. Pelvic rest for 6 weeks.  Also refer to After Visit Summary Diet: Regular Medications: Allergies as of 10/06/2019   No Known Allergies     Medication List    TAKE these medications   prenatal multivitamin Tabs tablet Take 1 tablet by mouth daily at 12 noon.  Outpatient follow up:  Follow-up Information    Harlin Heys, MD Follow up in 6 week(s).   Specialties: Obstetrics and Gynecology, Radiology Contact information: [address] [place] [address] [phone]          Postpartum contraception: Will discuss at first office visit post-partum  Discharged Condition: good  Discharged to: home  Newborn Data: Disposition:home with mother  Apgars: APGAR (1 MIN): 8   APGAR (5 MINS): 9   APGAR (10 MINS):    Baby Feeding: Breast    Finis Bud, M.D. 10/06/2019 7:33 AM

## 2019-10-06 NOTE — Lactation Note (Signed)
This note was copied from a baby's chart. Lactation Consultation Note  Patient Name: Abigail Johnston [id number] Date: 10/06/2019 Reason for consult: Follow-up assessment;1st time breastfeeding;Term  LC follow-up before discharge. Mom reports feedings are improving, baby is staying at breast longer, and mom feels a strong suck when feeding. Mom continues to pump post feedings for additional stimulation and milk removal.  Reinforced breastfeeding basics education previously given, cleaning of pump supplies and nipple shield, signs of adequate milk transfer, BF support post discharge, and breast care.   Maternal Data Formula Feeding for Exclusion: No Has patient been taught Hand Expression?: Yes Does the patient have breastfeeding experience prior to this delivery?: No  Feeding    LATCH Score                   Interventions Interventions: Breast feeding basics reviewed;DEBP  Lactation Tools Discussed/Used Tools: Nipple Shields Nipple shield size: 20 Breast pump type: Double-Electric Breast Pump   Consult Status Consult Status: Complete Date: 10/06/19 Follow-up type: Call as needed    Danford Bad 10/06/2019, 10:08 AM

## 2019-10-07 ENCOUNTER — Encounter: Payer: BC Managed Care – PPO | Admitting: Obstetrics and Gynecology

## 2019-11-10 ENCOUNTER — Encounter: Payer: Self-pay | Admitting: Obstetrics and Gynecology

## 2019-11-10 ENCOUNTER — Other Ambulatory Visit: Payer: Self-pay

## 2019-11-10 ENCOUNTER — Encounter: Payer: Self-pay | Admitting: Surgical

## 2019-11-10 ENCOUNTER — Ambulatory Visit (INDEPENDENT_AMBULATORY_CARE_PROVIDER_SITE_OTHER): Payer: BC Managed Care – PPO | Admitting: Obstetrics and Gynecology

## 2019-11-10 NOTE — Progress Notes (Signed)
HPI:      [name] is a 34 y.o. G2P1011 who LMP was No LMP recorded. (Menstrual status: Lactating).  Subjective:   She presents today approximately 6 weeks postpartum.  She bottle feeds and pumps.  She would like to continue pumping.  She has not resumed intercourse yet.  She has not had a period yet.  She has considered multiple different methods of birth control but has not yet decided.    Hx: The following portions of the patient's history were reviewed and updated as appropriate:             She  has a past medical history of Headache and Migraine headache with aura. She does not have any pertinent problems on file. She  has a past surgical history that includes Wisdom tooth extraction. Her family history includes Breast cancer in her maternal grandmother; Cancer in her maternal uncle; Healthy in her father and mother. She  reports that she has never smoked. She has never used smokeless tobacco. She reports that she does not drink alcohol and does not use drugs. She has a current medication list which includes the following prescription(s): prenatal multivitamin. She has No Known Allergies.       Review of Systems:  Review of Systems  Constitutional: Denied constitutional symptoms, night sweats, recent illness, fatigue, fever, insomnia and weight loss.  Eyes: Denied eye symptoms, eye pain, photophobia, vision change and visual disturbance.  Ears/Nose/Throat/Neck: Denied ear, nose, throat or neck symptoms, hearing loss, nasal discharge, sinus congestion and sore throat.  Cardiovascular: Denied cardiovascular symptoms, arrhythmia, chest pain/pressure, edema, exercise intolerance, orthopnea and palpitations.  Respiratory: Denied pulmonary symptoms, asthma, pleuritic pain, productive sputum, cough, dyspnea and wheezing.  Gastrointestinal: Denied, gastro-esophageal reflux, melena, nausea and vomiting.  Genitourinary: Denied genitourinary symptoms including symptomatic vaginal  discharge, pelvic relaxation issues, and urinary complaints.  Musculoskeletal: Denied musculoskeletal symptoms, stiffness, swelling, muscle weakness and myalgia.  Dermatologic: Denied dermatology symptoms, rash and scar.  Neurologic: Denied neurology symptoms, dizziness, headache, neck pain and syncope.  Psychiatric: Denied psychiatric symptoms, anxiety and depression.  Endocrine: Denied endocrine symptoms including hot flashes and night sweats.   Meds:   Current Outpatient Medications on File Prior to Visit  Medication Sig Dispense Refill  . Prenatal Vit-Fe Fumarate-FA (PRENATAL MULTIVITAMIN) TABS tablet Take 1 tablet by mouth daily at 12 noon.     No current facility-administered medications on file prior to visit.    Objective:     Vitals:   11/10/19 1436  BP: 95/64  Pulse: 64              Physical examination   Pelvic:   Vulva: Normal appearance.  No lesions.  Vagina: No lesions or abnormalities noted.  Support: Normal pelvic support.  Urethra No masses tenderness or scarring.  Meatus Normal size without lesions or prolapse.  Cervix: Normal appearance.  No lesions.  Anus: Normal exam.  No lesions.  Perineum: Normal exam.  No lesions.        Bimanual   Uterus: Normal size.  Non-tender.  Mobile.  AV.  Adnexae: No masses.  Non-tender to palpation.  Cul-de-sac: Negative for abnormality.     Assessment:    G2P1011 Patient Active Problem List   Diagnosis Date Noted  . Pregnancy 10/04/2019  . Vitamin D deficiency 06/08/2016  . 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 Postpartum care and examination immediately after delivery     Patient doing very well postpartum.  Baby doing  well.   Plan:            1.  Patient may resume normal activities with exception of heavy lifting.  2.  I have discussed multiple forms of birth control with her in detail.  Risk benefits of each reviewed.  She has still not decided.  I have asked her to call the office when she makes  her decision and we will arrange for birth control as soon as possible.  Orders No orders of the defined types were placed in this encounter.   No orders of the defined types were placed in this encounter.     F/U  No follow-ups on file.  Elonda Husky, M.D. 11/10/2019 2:58 PM

## 2020-01-20 ENCOUNTER — Other Ambulatory Visit: Payer: Self-pay

## 2020-01-20 ENCOUNTER — Ambulatory Visit: Payer: BC Managed Care – PPO | Admitting: Dermatology

## 2020-01-20 DIAGNOSIS — L308 Other specified dermatitis: Secondary | ICD-10-CM | POA: Diagnosis not present

## 2020-01-20 MED ORDER — HYDROCORTISONE 2.5 % EX CREA: TOPICAL_CREAM | Freq: Two times a day (BID) | CUTANEOUS | 2 refills | Status: DC | PRN

## 2020-01-20 MED ORDER — TRIAMCINOLONE ACETONIDE 0.1 % EX OINT: TOPICAL_OINTMENT | CUTANEOUS | 2 refills | Status: DC

## 2020-01-20 NOTE — Patient Instructions (Addendum)
Gentle Skin Care Guide  1. Bathe no more than once a day.  2. Avoid bathing in hot water  3. Use a mild soap like Dove, Vanicream, Cetaphil, CeraVe. Can use Lever 2000 or Cetaphil antibacterial soap  4. Use soap only where you need it. On most days, use it under your arms, between your legs, and on your feet. Let the water rinse other areas unless visibly dirty.  5. When you get out of the bath/shower, use a towel to gently blot your skin dry, don't rub it.  6. While your skin is still a little damp, apply a moisturizing cream such as Vanicream, CeraVe, Cetaphil, Eucerin, Sarna lotion or plain Vaseline Jelly. For hands apply Neutrogena Norwegian Hand Cream or Excipial Hand Cream.  7. Reapply moisturizer any time you start to itch or feel dry.  8. Sometimes using free and clear laundry detergents can be helpful. Fabric softener sheets should be avoided. Downy Free & Gentle liquid, or any liquid fabric softener that is free of dyes and perfumes, it acceptable to use  9. If your doctor has given you prescription creams you may apply moisturizers over them   Topical steroids (such as triamcinolone, fluocinolone, fluocinonide, mometasone, clobetasol, halobetasol, betamethasone, hydrocortisone) can cause thinning and lightening of the skin if they are used for too long in the same area. Your physician has selected the right strength medicine for your problem and area affected on the body. Please use your medication only as directed by your physician to prevent side effects.   

## 2020-01-20 NOTE — Progress Notes (Signed)
   New Patient Visit  Subjective  Abigail Johnston is a 34 y.o. female who presents for the following: Skin Problem (Patient here today to have skin at face evaluated. She had dry patches on her face that made her skin look burnt which is why she made appt. Has since cleared up but anytime she uses anything other then lotion on her face it burns. ).  No history of eczema. She does get dry patches on her shoulders too. Dry patches have been occurring for about 2 years.  She has not tried any treatment.  Patient advises sunscreens burn but not make up when she does wear it which is not often.   The following portions of the chart were reviewed this encounter and updated as appropriate:  Tobacco  Allergies  Meds  Problems  Med Hx  Surg Hx  Fam Hx      Review of Systems:  No other skin or systemic complaints except as noted in HPI or Assessment and Plan.  Objective  Well appearing patient in no apparent distress; mood and affect are within normal limits.  A focused examination was performed including face, neck, chest and back and face, shoulders, arms. Relevant physical exam findings are noted in the Assessment and Plan.  Objective  Right Shoulder - Anterior: Scaly erythematous papules and patches +/- dyspigmentation, lichenification, excoriations.    Assessment & Plan  Other eczema Right Shoulder - Anterior  Chronic, flared  Start TMC 0.1% ointment twice daily to affected areas arms until clear. Avoid applying to face, groin, and axilla. Use as directed. Risk of skin atrophy with long-term use reviewed.   Start HC 2.5% cream to affected areas twice daily to face as needed.   Avoid products with fragrance. Recommend Gentle Skin Care.   May consider patch testing in future if does not respond well to therapy  Topical steroids (such as triamcinolone, fluocinolone, fluocinonide, mometasone, clobetasol, halobetasol, betamethasone, hydrocortisone) can cause thinning and  lightening of the skin if they are used for too long in the same area. Your physician has selected the right strength medicine for your problem and area affected on the body. Please use your medication only as directed by your physician to prevent side effects.    Ordered Medications: hydrocortisone 2.5 % cream triamcinolone ointment (KENALOG) 0.1 %  Return in about 1 month (around 02/19/2020) for eczema follow up.  Anise Salvo, RMA, am acting as scribe for Darden Dates, MD .  Documentation: I have reviewed the above documentation for accuracy and completeness, and I agree with the above.  Darden Dates, MD

## 2020-02-01 ENCOUNTER — Encounter: Payer: Self-pay | Admitting: Dermatology

## 2020-03-02 ENCOUNTER — Ambulatory Visit: Payer: BC Managed Care – PPO | Admitting: Dermatology

## 2021-01-19 ENCOUNTER — Emergency Department: Payer: BC Managed Care – PPO | Admitting: Anesthesiology

## 2021-01-19 ENCOUNTER — Ambulatory Visit
Admission: EM | Admit: 2021-01-19 | Discharge: 2021-01-20 | Disposition: A | Discharge status: Home / Self Care | Payer: BC Managed Care – PPO | Attending: Emergency Medicine | Admitting: Emergency Medicine

## 2021-01-19 ENCOUNTER — Emergency Department: Payer: BC Managed Care – PPO

## 2021-01-19 ENCOUNTER — Other Ambulatory Visit: Payer: Self-pay

## 2021-01-19 ENCOUNTER — Encounter
Admission: EM | Disposition: A | Discharge status: Home / Self Care | Payer: Self-pay | Source: Home / Self Care | Attending: Emergency Medicine

## 2021-01-19 ENCOUNTER — Encounter: Payer: Self-pay | Admitting: Emergency Medicine

## 2021-01-19 DIAGNOSIS — Z9079 Acquired absence of other genital organ(s): Secondary | ICD-10-CM

## 2021-01-19 DIAGNOSIS — O00202 Left ovarian pregnancy without intrauterine pregnancy: Secondary | ICD-10-CM

## 2021-01-19 DIAGNOSIS — Z20822 Contact with and (suspected) exposure to covid-19: Secondary | ICD-10-CM | POA: Insufficient documentation

## 2021-01-19 DIAGNOSIS — Z3A01 Less than 8 weeks gestation of pregnancy: Secondary | ICD-10-CM | POA: Diagnosis not present

## 2021-01-19 DIAGNOSIS — O00102 Left tubal pregnancy without intrauterine pregnancy: Secondary | ICD-10-CM

## 2021-01-19 DIAGNOSIS — O4690 Antepartum hemorrhage, unspecified, unspecified trimester: Secondary | ICD-10-CM

## 2021-01-19 DIAGNOSIS — N939 Abnormal uterine and vaginal bleeding, unspecified: Secondary | ICD-10-CM | POA: Diagnosis present

## 2021-01-19 DIAGNOSIS — O209 Hemorrhage in early pregnancy, unspecified: Secondary | ICD-10-CM

## 2021-01-19 DIAGNOSIS — O09521 Supervision of elderly multigravida, first trimester: Secondary | ICD-10-CM | POA: Insufficient documentation

## 2021-01-19 HISTORY — PX: LAPAROSCOPY: SHX197

## 2021-01-19 LAB — CBC WITH DIFFERENTIAL/PLATELET
Abs Immature Granulocytes: 0.02 10*3/uL (ref 0.00–0.07)
Basophils Absolute: 0 10*3/uL (ref 0.0–0.1)
Basophils Relative: 0 %
Eosinophils Absolute: 0.1 10*3/uL (ref 0.0–0.5)
Eosinophils Relative: 1 %
HCT: 39.8 % (ref 36.0–46.0)
Hemoglobin: 13.9 g/dL (ref 12.0–15.0)
Immature Granulocytes: 0 %
Lymphocytes Relative: 16 %
Lymphs Abs: 1.4 10*3/uL (ref 0.7–4.0)
MCH: 32.5 pg (ref 26.0–34.0)
MCHC: 34.9 g/dL (ref 30.0–36.0)
MCV: 93 fL (ref 80.0–100.0)
Monocytes Absolute: 0.8 10*3/uL (ref 0.1–1.0)
Monocytes Relative: 9 %
Neutro Abs: 6.3 10*3/uL (ref 1.7–7.7)
Neutrophils Relative %: 74 %
Platelets: 238 10*3/uL (ref 150–400)
RBC: 4.28 MIL/uL (ref 3.87–5.11)
RDW: 13.1 % (ref 11.5–15.5)
WBC: 8.5 10*3/uL (ref 4.0–10.5)
nRBC: 0 % (ref 0.0–0.2)

## 2021-01-19 LAB — RESP PANEL BY RT-PCR (FLU A&B, COVID) ARPGX2
Influenza A by PCR: NEGATIVE
Influenza B by PCR: NEGATIVE
SARS Coronavirus 2 by RT PCR: NEGATIVE

## 2021-01-19 LAB — TYPE AND SCREEN
ABO/RH(D): B POS
Antibody Screen: NEGATIVE

## 2021-01-19 LAB — HCG, QUANTITATIVE, PREGNANCY: hCG, Beta Chain, Quant, S: 11452 m[IU]/mL — ABNORMAL HIGH (ref <5)

## 2021-01-19 SURGERY — LAPAROSCOPY, DIAGNOSTIC
Anesthesia: General | Laterality: Left

## 2021-01-19 MED ORDER — LACTATED RINGERS IV SOLN: INTRAVENOUS | Status: DC

## 2021-01-19 MED ORDER — PROPOFOL 10 MG/ML IV BOLUS
INTRAVENOUS | Status: AC
  Filled 2021-01-19: qty 20

## 2021-01-19 MED ORDER — FENTANYL CITRATE (PF) 100 MCG/2ML IJ SOLN
INTRAMUSCULAR | Status: AC
  Filled 2021-01-19: qty 2

## 2021-01-19 MED ORDER — OXYCODONE-ACETAMINOPHEN 5-325 MG PO TABS: 1.0000 | ORAL_TABLET | Freq: Four times a day (QID) | ORAL | 0 refills | Status: DC | PRN

## 2021-01-19 MED ORDER — VASOPRESSIN 20 UNIT/ML IV SOLN
INTRAVENOUS | Status: DC | PRN
  Administered 2021-01-19: 20 [IU]

## 2021-01-19 MED ORDER — DEXAMETHASONE SODIUM PHOSPHATE 10 MG/ML IJ SOLN
INTRAMUSCULAR | Status: DC | PRN
  Administered 2021-01-19: 5 mg via INTRAVENOUS

## 2021-01-19 MED ORDER — MIDAZOLAM HCL 2 MG/2ML IJ SOLN
INTRAMUSCULAR | Status: DC | PRN
  Administered 2021-01-19: 2 mg via INTRAVENOUS

## 2021-01-19 MED ORDER — KETOROLAC TROMETHAMINE 30 MG/ML IJ SOLN
INTRAMUSCULAR | Status: DC | PRN
  Administered 2021-01-19: 15 mg via INTRAVENOUS

## 2021-01-19 MED ORDER — ROCURONIUM BROMIDE 100 MG/10ML IV SOLN
INTRAVENOUS | Status: DC | PRN
  Administered 2021-01-19: 40 mg via INTRAVENOUS

## 2021-01-19 MED ORDER — PROPOFOL 10 MG/ML IV BOLUS
INTRAVENOUS | Status: DC | PRN
  Administered 2021-01-19: 90 mg via INTRAVENOUS
  Administered 2021-01-19: 30 mg via INTRAVENOUS

## 2021-01-19 MED ORDER — BUPIVACAINE HCL 0.5 % IJ SOLN
INTRAMUSCULAR | Status: DC | PRN
  Administered 2021-01-19: 20 mL

## 2021-01-19 MED ORDER — DOCUSATE SODIUM 100 MG PO CAPS: 100.0000 mg | ORAL_CAPSULE | Freq: Two times a day (BID) | ORAL | 2 refills | Status: DC | PRN

## 2021-01-19 MED ORDER — ONDANSETRON HCL 4 MG/2ML IJ SOLN
INTRAMUSCULAR | Status: AC
  Filled 2021-01-19: qty 2

## 2021-01-19 MED ORDER — IBUPROFEN 600 MG PO TABS: 600.0000 mg | ORAL_TABLET | Freq: Four times a day (QID) | ORAL | 1 refills | Status: DC | PRN

## 2021-01-19 MED ORDER — OXYCODONE HCL 5 MG PO TABS: 5.0000 mg | ORAL_TABLET | Freq: Once | ORAL | Status: DC | PRN

## 2021-01-19 MED ORDER — OXYCODONE HCL 5 MG/5ML PO SOLN
5.0000 mg | Freq: Once | ORAL | Status: DC | PRN
Start: 2021-01-19 — End: 2021-01-20

## 2021-01-19 MED ORDER — ACETAMINOPHEN 10 MG/ML IV SOLN
INTRAVENOUS | Status: DC | PRN
  Administered 2021-01-19: 700 mg via INTRAVENOUS

## 2021-01-19 MED ORDER — SIMETHICONE 80 MG PO CHEW: 80.0000 mg | CHEWABLE_TABLET | Freq: Four times a day (QID) | ORAL | 0 refills | Status: DC | PRN

## 2021-01-19 MED ORDER — LACTATED RINGERS IR SOLN
Status: DC | PRN
  Administered 2021-01-19: 300 mL

## 2021-01-19 MED ORDER — FENTANYL CITRATE (PF) 100 MCG/2ML IJ SOLN
INTRAMUSCULAR | Status: DC | PRN
  Administered 2021-01-19: 50 ug via INTRAVENOUS
  Administered 2021-01-19 (×2): 25 ug via INTRAVENOUS

## 2021-01-19 MED ORDER — FENTANYL CITRATE (PF) 100 MCG/2ML IJ SOLN: 25.0000 ug | INTRAMUSCULAR | Status: DC | PRN

## 2021-01-19 MED ORDER — LACTATED RINGERS IV SOLN: INTRAVENOUS | Status: DC | PRN

## 2021-01-19 MED ORDER — MIDAZOLAM HCL 2 MG/2ML IJ SOLN
INTRAMUSCULAR | Status: AC
  Filled 2021-01-19: qty 2

## 2021-01-19 MED ORDER — DEXAMETHASONE SODIUM PHOSPHATE 10 MG/ML IJ SOLN
INTRAMUSCULAR | Status: AC
  Filled 2021-01-19: qty 1

## 2021-01-19 MED ORDER — VASOPRESSIN 20 UNIT/ML IV SOLN
INTRAVENOUS | Status: AC
  Filled 2021-01-19: qty 1

## 2021-01-19 MED ORDER — LIDOCAINE HCL (CARDIAC) PF 100 MG/5ML IV SOSY
PREFILLED_SYRINGE | INTRAVENOUS | Status: DC | PRN
  Administered 2021-01-19: 60 mg via INTRAVENOUS

## 2021-01-19 MED ORDER — SUGAMMADEX SODIUM 200 MG/2ML IV SOLN
INTRAVENOUS | Status: DC | PRN
  Administered 2021-01-19: 100 mg via INTRAVENOUS

## 2021-01-19 MED ORDER — BUPIVACAINE-EPINEPHRINE (PF) 0.5% -1:200000 IJ SOLN
INTRAMUSCULAR | Status: AC
  Filled 2021-01-19: qty 30

## 2021-01-19 MED ORDER — ACETAMINOPHEN 10 MG/ML IV SOLN
INTRAVENOUS | Status: AC
  Filled 2021-01-19: qty 100

## 2021-01-19 MED ORDER — ACETAMINOPHEN 500 MG PO TABS: 1000.0000 mg | ORAL_TABLET | ORAL | Status: DC

## 2021-01-19 MED ORDER — PHENYLEPHRINE HCL (PRESSORS) 10 MG/ML IV SOLN
INTRAVENOUS | Status: DC | PRN
  Administered 2021-01-19: 200 ug via INTRAVENOUS
  Administered 2021-01-19 (×4): 100 ug via INTRAVENOUS

## 2021-01-19 MED ORDER — ONDANSETRON HCL 4 MG/2ML IJ SOLN
INTRAMUSCULAR | Status: DC | PRN
  Administered 2021-01-19: 4 mg via INTRAVENOUS

## 2021-01-19 MED ORDER — KETOROLAC TROMETHAMINE 30 MG/ML IJ SOLN
INTRAMUSCULAR | Status: AC
  Filled 2021-01-19: qty 1

## 2021-01-19 SURGICAL SUPPLY — 43 items
BLADE SURG SZ11 CARB STEEL (BLADE) ×2 IMPLANT
CATH ROBINSON RED A/P 16FR (CATHETERS) ×2 IMPLANT
CHLORAPREP W/TINT 26 (MISCELLANEOUS) ×2 IMPLANT
CORD MONOPOLAR M/FML 12FT (MISCELLANEOUS) ×2 IMPLANT
COVER LIGHT HANDLE STERIS (MISCELLANEOUS) ×2 IMPLANT
DECANTER SPIKE VIAL GLASS SM (MISCELLANEOUS) ×2 IMPLANT
DERMABOND ADVANCED (GAUZE/BANDAGES/DRESSINGS) ×1
DERMABOND ADVANCED .7 DNX12 (GAUZE/BANDAGES/DRESSINGS) ×1 IMPLANT
GAUZE 4X4 16PLY ~~LOC~~+RFID DBL (SPONGE) ×2 IMPLANT
GLOVE SURG ENC MOIS LTX SZ6.5 (GLOVE) ×6 IMPLANT
GLOVE SURG ENC MOIS LTX SZ8 (GLOVE) IMPLANT
GLOVE SURG UNDER LTX SZ7 (GLOVE) ×6 IMPLANT
GOWN STRL REUS W/ TWL LRG LVL3 (GOWN DISPOSABLE) ×3 IMPLANT
GOWN STRL REUS W/TWL LRG LVL3 (GOWN DISPOSABLE) ×3
GOWN STRL REUS W/TWL XL LVL4 (GOWN DISPOSABLE) IMPLANT
GRASPER SUT TROCAR 14GX15 (MISCELLANEOUS) IMPLANT
IRRIGATION STRYKERFLOW (MISCELLANEOUS) ×1 IMPLANT
IRRIGATOR STRYKERFLOW (MISCELLANEOUS) ×2
IV LACTATED RINGERS 1000ML (IV SOLUTION) ×2 IMPLANT
KIT PINK PAD W/HEAD ARE REST (MISCELLANEOUS) ×2 IMPLANT
KIT PINK PAD W/HEAD ARM REST (MISCELLANEOUS) ×1 IMPLANT
KIT TURNOVER CYSTO (KITS) IMPLANT
MANIFOLD NEPTUNE II (INSTRUMENTS) ×2 IMPLANT
NS IRRIG 500ML POUR BTL (IV SOLUTION) ×2 IMPLANT
PACK GYN LAPAROSCOPIC (MISCELLANEOUS) ×2 IMPLANT
PAD OB MATERNITY 4.3X12.25 (PERSONAL CARE ITEMS) ×2 IMPLANT
PAD PREP 24X41 OB/GYN DISP (PERSONAL CARE ITEMS) ×2 IMPLANT
POUCH ENDO CATCH 10MM SPEC (MISCELLANEOUS) ×2 IMPLANT
POUCH SPECIMEN RETRIEVAL 10MM (ENDOMECHANICALS) IMPLANT
SCISSORS METZENBAUM CVD 33 (INSTRUMENTS) ×2 IMPLANT
SCRUB EXIDINE 4% CHG 4OZ (MISCELLANEOUS) IMPLANT
SET TUBE SMOKE EVAC HIGH FLOW (TUBING) ×2 IMPLANT
SHEARS HARMONIC ACE PLUS 36CM (ENDOMECHANICALS) IMPLANT
SLEEVE ENDOPATH XCEL 5M (ENDOMECHANICALS) ×4 IMPLANT
SLEEVE SCD COMPRESS KNEE LRG (STOCKING) ×2 IMPLANT
SUT VIC AB 3-0 SH 27 (SUTURE) ×1
SUT VIC AB 3-0 SH 27X BRD (SUTURE) ×1 IMPLANT
SUT VICRYL 0 AB UR-6 (SUTURE) ×2 IMPLANT
SYR 5ML LL (SYRINGE) ×2 IMPLANT
TROCAR ENDO BLADELESS 11MM (ENDOMECHANICALS) ×2 IMPLANT
TROCAR XCEL NON-BLD 5MMX100MML (ENDOMECHANICALS) ×2 IMPLANT
TROCAR XCEL UNIV SLVE 11M 100M (ENDOMECHANICALS) IMPLANT
WATER STERILE IRR 500ML POUR (IV SOLUTION) ×2 IMPLANT

## 2021-01-19 NOTE — ED Triage Notes (Signed)
Pt comes into the ED via POV c/o generalized abd cramping, vaginal spotting, and pregnancy.  Pt is unsure how far along the is in pregnancy, but states she is scheduled for an OB appt but the appt isnt until a couple weeks.  Pt describes the vaginal bleeding as intermittent spotting.  Pt ambulatory to triage and in NAD.

## 2021-01-19 NOTE — Anesthesia Preprocedure Evaluation (Addendum)
Anesthesia Evaluation  Patient identified by MRN, date of birth, ID band Patient awake    Reviewed: Allergy & Precautions, NPO status , Patient's Chart, lab work & pertinent test results  History of Anesthesia Complications (+) PONV and history of anesthetic complications  Airway Mallampati: III  TM Distance: >3 FB Neck ROM: full    Dental  (+) Chipped   Pulmonary neg pulmonary ROS, neg shortness of breath,    Pulmonary exam normal        Cardiovascular Exercise Tolerance: Good (-) angina(-) Past MI and (-) DOE negative cardio ROS Normal cardiovascular exam     Neuro/Psych  Headaches, negative psych ROS   GI/Hepatic negative GI ROS, Neg liver ROS,   Endo/Other  negative endocrine ROS  Renal/GU      Musculoskeletal   Abdominal   Peds  Hematology negative hematology ROS (+)   Anesthesia Other Findings Past Medical History: No date: Headache No date: Migraine headache with aura  Past Surgical History: No date: WISDOM TOOTH EXTRACTION  BMI    Body Mass Index: 29.84 kg/m      Reproductive/Obstetrics negative OB ROS                            Anesthesia Physical Anesthesia Plan  ASA: 2  Anesthesia Plan: General ETT   Post-op Pain Management:    Induction: Intravenous  PONV Risk Score and Plan: Ondansetron, Dexamethasone, Midazolam and Treatment may vary due to age or medical condition  Airway Management Planned: Oral ETT  Additional Equipment:   Intra-op Plan:   Post-operative Plan: Extubation in OR  Informed Consent: I have reviewed the patients History and Physical, chart, labs and discussed the procedure including the risks, benefits and alternatives for the proposed anesthesia with the patient or authorized representative who has indicated his/her understanding and acceptance.     Dental Advisory Given  Plan Discussed with: Anesthesiologist, CRNA and  Surgeon  Anesthesia Plan Comments: (Patient consented for risks of anesthesia including but not limited to:  - adverse reactions to medications - damage to eyes, teeth, lips or other oral mucosa - nerve damage due to positioning  - sore throat or hoarseness - Damage to heart, brain, nerves, lungs, other parts of body or loss of life  Patient voiced understanding.)        Anesthesia Quick Evaluation

## 2021-01-19 NOTE — ED Provider Notes (Signed)
[facility] Emergency Department Provider Note   ____________________________________________   Event Date/Time   First MD Initiated Contact with Patient 01/19/21 1656     (approximate)  I have reviewed the triage vital signs and the nursing notes.   HISTORY  Chief Complaint Abdominal Pain and Vaginal Bleeding    HPI Abigail Johnston is a 35 y.o. female who presents for left adnexal pain and vaginal bleeding  LOCATION: Left adnexa DURATION: 3 days prior to arrival TIMING: Worsening since onset SEVERITY: 5/10 QUALITY: Aching, cramping CONTEXT: Patient states that she first noticed left adnexal pain a few days prior to arrival that has been worsening since onset and is now associated with vaginal bleeding MODIFYING FACTORS: Denies any exacerbating or relieving factors ASSOCIATED SYMPTOMS: Vaginal bleeding   Per medical record review, patient has LMP on 7/10 and states that she is G2, P1          Past Medical History:  Diagnosis Date   Headache    Migraine headache with aura     Patient Active Problem List   Diagnosis Date Noted   Pregnancy 10/04/2019   Vitamin D deficiency 06/08/2016   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 Authorizing Provider  hydrocortisone 2.5 % cream Apply topically 2 (two) times daily as needed (Rash). Up to 1 week at a time 01/20/20   [facility], [place], MD  Prenatal Vit-Fe Fumarate-FA (PRENATAL MULTIVITAMIN) TABS tablet Take 1 tablet by mouth daily at 12 noon. Patient not taking: Reported on 01/20/2020    [provider]  triamcinolone ointment (KENALOG) 0.1 % Apply to affected areas arms twice daily as needed for itch/rash. Avoid applying to face, groin, and axilla. Use as directed. Risk of skin atrophy with long-term use reviewed. 01/20/20   [place],  [place], MD    Allergies Patient has no known allergies.  Family History  Problem Relation Age of Onset   Healthy Mother    Healthy Father    Breast cancer Maternal Grandmother        deceased in late 33s   Cancer Maternal Uncle        unknown type    Social History Social History   Tobacco Use   Smoking status: Never   Smokeless tobacco: Never  Vaping Use   Vaping Use: Never used  Substance Use Topics   Alcohol use: No   Drug use: No    Review of Systems Constitutional: No fever/chills Eyes: No visual changes. ENT: No sore throat. Cardiovascular: Denies chest pain. Respiratory: Denies shortness of breath. Gastrointestinal: Endorses left lower quadrant abdominal pain.  No nausea, no vomiting.  No diarrhea. Genitourinary: Endorses vaginal bleeding.  Negative for dysuria. Musculoskeletal: Negative for acute arthralgias Skin: Negative for rash. Neurological: Negative for headaches, weakness/numbness/paresthesias in any extremity Psychiatric: Negative for suicidal ideation/homicidal ideation   ____________________________________________   PHYSICAL EXAM:  VITAL SIGNS: ED Triage Vitals  Enc Vitals Group     BP 01/19/21 1425 (!) 96/57     Pulse Rate 01/19/21 1425 67     Resp 01/19/21 1425 18     Temp 01/19/21 1425 98.3 F (36.8 C)     Temp Source 01/19/21 1425 Oral     SpO2 01/19/21 1425 99 %     Weight 01/19/21 1355 103 lb 9.9 oz (47 kg)  Height 01/19/21 1355 4' 11.5" (1.511 m)     Head Circumference --      Peak Flow --      Pain Score 01/19/21 1355 5     Pain Loc --      Pain Edu? --      Excl. in GC? --    Constitutional: Alert and oriented. Well appearing and in no acute distress. Eyes: Conjunctivae are normal. PERRL. Head: Atraumatic. Nose: No congestion/rhinnorhea. Mouth/Throat: Mucous membranes are moist. Neck: No stridor Cardiovascular: Grossly normal heart sounds.  Good peripheral circulation. Respiratory: Normal respiratory effort.   No retractions. Gastrointestinal: Soft and mild left lower quadrant abdominal tenderness to palpation. No distention. Pelvic exam: Deferred secondary to ultrasound showing ectopic pregnancy Musculoskeletal: No obvious deformities Neurologic:  Normal speech and language. No gross focal neurologic deficits are appreciated. Skin:  Skin is warm and dry. No rash noted. Psychiatric: Mood and affect are normal. Speech and behavior are normal.  ____________________________________________   LABS (all labs ordered are listed, but only abnormal results are displayed)  Labs Reviewed  HCG, QUANTITATIVE, PREGNANCY - Abnormal; Notable for the following components:      Result Value   hCG, Beta Chain, Quant, S 11,452 (*)    All other components within normal limits  RESP PANEL BY RT-PCR (FLU A&B, COVID) ARPGX2  CBC WITH DIFFERENTIAL/PLATELET  POC URINE PREG, ED  TYPE AND SCREEN    RADIOLOGY  ED MD interpretation: First trimester OB ultrasound shows left adnexal ectopic pregnancy with embryo and no sign of rupture  Official radiology report(s): US OB Comp Less 14 Wks  Result Date: 01/19/2021 CLINICAL DATA:  Vaginal bleeding for 2-3 days. Pain for 5 days. [place] of [address]. Gestational age by last menstrual period of 9 weeks and 3 days. Last menstrual period 11/14/2020. Estimated due date 08/21/2021. EXAM: OBSTETRIC <14 WK [place] AND TRANSVAGINAL OB US TECHNIQUE: Both transabdominal and transvaginal ultrasound examinations were performed for complete evaluation of the gestation as well as the maternal uterus, adnexal regions, and pelvic cul-de-sac. Transvaginal technique was performed to assess early pregnancy. COMPARISON:  None. FINDINGS: Intrauterine gestational sac: None Yolk sac:  Visualized within the left adnexal ectopic pregnancy. Embryo:  Visualized within the left adnexal ectopic pregnancy. Cardiac Activity: Not Visualized. CRL:  14.2 mm   7 w   5 d                  [place] EDC: 09/02/2021  Subchorionic hemorrhage:  None visualized. Maternal uterus/adnexae: Uterus is unremarkable with an endometrium of 15 mm. Left ovary is unremarkable; however, there is a separate adnexal mass identified demonstrating a gestational sac, yolk sac, and embryo. No cardiac activity identified. The right ovary is unremarkable.  No right adnexal mass. Other: Trace simple free fluid within the pelvis. IMPRESSION: Left adnexal ectopic pregnancy with embryo, gestational age of [redacted] weeks and 5 days. No findings to suggest rupture. These results were called by telephone at the time of interpretation on 01/19/2021 at 6:07 pm to provider [facility] , who verbally acknowledged these results. Electronically Signed   By: Tish Frederickson M.D.   On: 01/19/2021 18:10    ____________________________________________   PROCEDURES  Procedure(s) performed (including Critical Care):  .1-3 Lead EKG Interpretation Performed by: Merwyn Katos, MD Authorized by: Merwyn Katos, MD     Interpretation: normal     ECG rate:  66   ECG rate assessment: normal     Rhythm: sinus rhythm  Ectopy: none     Conduction: normal    CRITICAL CARE Performed by: Merwyn Katos   Total critical care time: 33 minutes  Critical care time was exclusive of separately billable procedures and treating other patients.  Critical care was necessary to treat or prevent imminent or life-threatening deterioration.  Critical care was time spent personally by me on the following activities: development of treatment plan with patient and/or surrogate as well as nursing, discussions with consultants, evaluation of patient's response to treatment, examination of patient, obtaining history from patient or surrogate, ordering and performing treatments and interventions, ordering and review of laboratory studies, ordering and review of radiographic studies, pulse oximetry and re-evaluation of patient's  condition.  ____________________________________________   INITIAL IMPRESSION / ASSESSMENT AND PLAN / ED COURSE  As part of my medical decision making, I reviewed the following data within the electronic medical record, if available:  Nursing notes reviewed and incorporated, Labs reviewed, EKG interpreted, Old chart reviewed, Radiograph reviewed and Notes from prior ED visits reviewed and incorporated     This pregnant patient presents with vaginal bleeding in the first trimester. DDX includes ectopic, IUP, threatened/inevitable abortion, along with completed abortion. Patient is HDS and without a history of coagulopathy or infectious symptoms. Doubt alternate acute emergent pathology.  Plan: bHCG, +/- basic labs, type and screen, TVUS, reassess  Findings: hCG 11,425 Ultrasound showing left adnexal ectopic unruptured pregnancy  Consult: I spoke to Dr. Valentino Saxon in OB/GYN who agrees to accept this patient to the OB/GYN service for further evaluation and management  Dispo: Admit to OB/GYN      ____________________________________________   FINAL CLINICAL IMPRESSION(S) / ED DIAGNOSES  Final diagnoses:  Left ovarian pregnancy without intrauterine pregnancy  Vaginal bleeding in pregnancy     ED Discharge Orders     None        Note:  This document was prepared using Dragon voice recognition software and may include unintentional dictation errors.    Merwyn Katos, MD 01/19/21 [place]

## 2021-01-19 NOTE — Transfer of Care (Signed)
Immediate Anesthesia Transfer of Care Note  Patient: Abigail Johnston  Procedure(s) Performed: LAPAROSCOPY DIAGNOSTIC LEFT SALPINGECTOMY WITH REMOVAL OF ECTOPIC (Left)  Patient Location: PACU  Anesthesia Type:General  Level of Consciousness: awake  Airway & Oxygen Therapy: Patient Spontanous Breathing and Patient connected to face mask oxygen  Post-op Assessment: Report given to RN and Post -op Vital signs reviewed and stable  Post vital signs: Reviewed and stable  Last Vitals:  Vitals Value Taken Time  BP 133/56 01/19/21 2303  Temp 36.5 C 01/19/21 2303  Pulse 108 01/19/21 2304  Resp 32 01/19/21 2304  SpO2 100 % 01/19/21 2304  Vitals shown include unvalidated device data.  Last Pain:  Vitals:   01/19/21 1903  TempSrc:   PainSc: 0-No pain         Complications: No notable events documented.

## 2021-01-19 NOTE — Anesthesia Procedure Notes (Signed)
Procedure Name: Intubation Date/Time: 01/19/2021 9:19 PM Performed by: Irving Burton, CRNA Pre-anesthesia Checklist: Patient identified, Patient being monitored, Timeout performed, Emergency Drugs available and Suction available Patient Re-evaluated:Patient Re-evaluated prior to induction Oxygen Delivery Method: Circle system utilized Preoxygenation: Pre-oxygenation with 100% oxygen Induction Type: IV induction Ventilation: Mask ventilation without difficulty Laryngoscope Size: McGraph and 3 Grade View: Grade I Tube type: Oral Tube size: 6.5 mm Number of attempts: 1 Airway Equipment and Method: Stylet Placement Confirmation: ETT inserted through vocal cords under direct vision, positive ETCO2 and breath sounds checked- equal and bilateral Secured at: 21 cm Tube secured with: Tape Dental Injury: Teeth and Oropharynx as per pre-operative assessment

## 2021-01-19 NOTE — Consult Note (Signed)
Reason for Consult: Ectopic Pregnancy Referring Physician: Donna Bernard, MD (ER Physician)  Abigail Johnston is an 35 y.o. [phone] female with last menstrual period 11/16/2020 with [redacted]w[redacted]d pregnancy by dates, EDD 08/23/2020 who presented to the Emergency Room for complaints of left sided pelvic pain x 3 days and onset of vaginal bleeding today.  Notes pain has progressively worsened over the past several days.  Bleeding is described as light in flow.  Denies nausea, vomiting, chills, shortness of breath or chest pain.   Pertinent Gynecological History: Menses: regular every month without intermenstrual spotting, usually lasting 4 to 5 days, and with minimal cramping, flow is moderate. Contraception: none Blood transfusions: none Sexually transmitted diseases: no past history Previous GYN Procedures:  None   Last pap: normal Date: 04/09/2019   Menstrual History: Menarche age: 35 Patient's last menstrual period was 11/16/2020.     OB History  Gravida Para Term Preterm AB Living  3 1 1   1 1   SAB IAB Ectopic Multiple Live Births        0 1    # Outcome Date GA Lbr Len/2nd Weight Sex Delivery Anes PTL Lv  3 Current           2 Term 10/04/19 [redacted]w[redacted]d  2320 g F Vag-Vacuum EPI  LIV  1 AB 2012            Past Medical History:  Diagnosis Date   Headache    Migraine headache with aura     Past Surgical History:  Procedure Laterality Date   WISDOM TOOTH EXTRACTION      Family History  Problem Relation Age of Onset   Healthy Mother    Healthy Father    Breast cancer Maternal Grandmother        deceased in late 72s   Cancer Maternal Uncle        unknown type    Social History:  reports that she has never smoked. She has never used smokeless tobacco. She reports that she does not drink alcohol and does not use drugs.  Allergies: No Known Allergies  No current facility-administered medications on file prior to encounter.   Current Outpatient Medications on File Prior to Encounter   Medication Sig Dispense Refill   hydrocortisone 2.5 % cream Apply topically 2 (two) times daily as needed (Rash). Up to 1 week at a time 30 g 2   Prenatal Vit-Fe Fumarate-FA (PRENATAL MULTIVITAMIN) TABS tablet Take 1 tablet by mouth daily at 12 noon. (Patient not taking: Reported on 01/20/2020)     triamcinolone ointment (KENALOG) 0.1 % Apply to affected areas arms twice daily as needed for itch/rash. Avoid applying to face, groin, and axilla. Use as directed. Risk of skin atrophy with long-term use reviewed. 80 g 2    Review of Systems  Constitutional:  Negative for chills and fever.  HENT: Negative.    Eyes: Negative.   Respiratory: Negative.    Cardiovascular: Negative.   Gastrointestinal: Negative.   Endocrine: Negative.   Genitourinary:  Positive for pelvic pain and vaginal bleeding.  Musculoskeletal: Negative.   Skin: Negative.    Blood pressure (!) 96/57, pulse 67, temperature 98.3 F (36.8 C), temperature source Oral, resp. rate 18, height 4' 1.5" (1.257 m), weight 47.2 kg, last menstrual period 11/16/2020, SpO2 99 %, currently breastfeeding. Physical Exam Constitutional:      General: She is not in acute distress.    Appearance: She is well-developed and normal weight. She is not ill-appearing.  HENT:     Head: Normocephalic and atraumatic.     Mouth/Throat:     Mouth: Mucous membranes are moist.  Cardiovascular:     Rate and Rhythm: Normal rate and regular rhythm.     Heart sounds: Normal heart sounds. No murmur heard.   No gallop.  Pulmonary:     Effort: Pulmonary effort is normal. No respiratory distress.     Breath sounds: Normal breath sounds. No wheezing.  Abdominal:     General: Abdomen is flat. Bowel sounds are normal. There is no distension.     Palpations: Abdomen is soft. There is no mass.     Tenderness: There is no abdominal tenderness.     Hernia: No hernia is present.  Genitourinary:    Comments: Deferred to OR Skin:    General: Skin is warm and  dry.  Neurological:     General: No focal deficit present.     Mental Status: She is alert and oriented to person, place, and time.  Psychiatric:        Mood and Affect: Mood normal.        Behavior: Behavior normal.    Results for orders placed or performed during the hospital encounter of 01/19/21 (from the past 48 hour(s))  hCG, quantitative, pregnancy     Status: Abnormal   Collection Time: 01/19/21  2:21 PM  Result Value Ref Range   hCG, Beta Chain, Quant, S 11,452 (H) <5 mIU/mL    Comment:          GEST. AGE      CONC.  (mIU/mL)   <=1 WEEK        5 - 50     2 WEEKS       50 - 500     3 WEEKS       100 - 10,000     4 WEEKS     1,000 - 30,000     5 WEEKS     3,500 - 115,000   6-8 WEEKS     12,000 - 270,000    12 WEEKS     15,000 - 220,000        FEMALE AND NON-PREGNANT FEMALE:     LESS THAN 5 mIU/mL Performed at [facility], [address] [place], [place] [address]   CBC with Differential     Status: None   Collection Time: 01/19/21  6:44 PM  Result Value Ref Range   WBC 8.5 4.0 - 10.5 K/uL   RBC 4.28 3.87 - 5.11 MIL/uL   Hemoglobin 13.9 12.0 - 15.0 g/dL   HCT 69.4 85.4 - 62.7 %   MCV 93.0 80.0 - 100.0 fL   MCH 32.5 26.0 - 34.0 pg   MCHC 34.9 30.0 - 36.0 g/dL   RDW 03.5 00.9 - 38.1 %   Platelets 238 150 - 400 K/uL   nRBC 0.0 0.0 - 0.2 %   Neutrophils Relative % 74 %   Neutro Abs 6.3 1.7 - 7.7 K/uL   Lymphocytes Relative 16 %   Lymphs Abs 1.4 0.7 - 4.0 K/uL   Monocytes Relative 9 %   Monocytes Absolute 0.8 0.1 - 1.0 K/uL   Eosinophils Relative 1 %   Eosinophils Absolute 0.1 0.0 - 0.5 K/uL   Basophils Relative 0 %   Basophils Absolute 0.0 0.0 - 0.1 K/uL   Immature Granulocytes 0 %   Abs Immature Granulocytes 0.02 0.00 - 0.07 K/uL    Comment: Performed  at [facility], [address] [place], [place] [address]  Type and screen [facility]     Status: None (Preliminary result)   Collection Time: 01/19/21  6:44 PM   Result Value Ref Range   ABO/RH(D) PENDING    Antibody Screen PENDING    Sample Expiration      01/22/2021,2359 Performed at [facility], [address] [place], [place] [address]     [place] Comp Less 14 Wks  Result Date: 01/19/2021 CLINICAL DATA:  Vaginal bleeding for 2-3 days. Pain for 5 days. [place] of [address]. Gestational age by last menstrual period of 9 weeks and 3 days. Last menstrual period 11/14/2020. Estimated due date 08/21/2021. EXAM: OBSTETRIC <14 WK [place] AND TRANSVAGINAL OB US TECHNIQUE: Both transabdominal and transvaginal ultrasound examinations were performed for complete evaluation of the gestation as well as the maternal uterus, adnexal regions, and pelvic cul-de-sac. Transvaginal technique was performed to assess early pregnancy. COMPARISON:  None. FINDINGS: Intrauterine gestational sac: None Yolk sac:  Visualized within the left adnexal ectopic pregnancy. Embryo:  Visualized within the left adnexal ectopic pregnancy. Cardiac Activity: Not Visualized. CRL:  14.2 mm   7 w   5 d                  [place] EDC: 09/02/2021 Subchorionic hemorrhage:  None visualized. Maternal uterus/adnexae: Uterus is unremarkable with an endometrium of 15 mm. Left ovary is unremarkable; however, there is a separate adnexal mass identified demonstrating a gestational sac, yolk sac, and embryo. No cardiac activity identified. The right ovary is unremarkable.  No right adnexal mass. Other: Trace simple free fluid within the pelvis. IMPRESSION: Left adnexal ectopic pregnancy with embryo, gestational age of [redacted] weeks and 5 days. No findings to suggest rupture. These results were called by telephone at the time of interpretation on 01/19/2021 at 6:07 pm to provider [facility] , who verbally acknowledged these results. Electronically Signed   By: Tish Frederickson M.D.   On: 01/19/2021 18:10    Assessment/Plan: Left ectopic pregnancy  - 35 y.o. G3P1011 with left ectopic pregnancy.   On exam, she  had stable vital signs, and light vaginal bleeding. No significant pelvic tenderness. Patient was counseled regarding need for surgical management for ectopic based on BHCG levels and gestational age/size of ectopic. Discussed option of salpingstomy vs salpingectomy, including risks and benefits of each method. Patient desires salpingostomy if possible. Risks of surgery including bleeding which may require transfusion or reoperation, infection, injury to bowel or other surrounding organs, need for additional procedures including laparotomy and salpingectomy were explained to patient and written informed consent was obtained.  Patient has been NPO since 12:30 pm and she will remain NPO for procedure. Anesthesia and OR aware.  Preoperative COVID testing and SCDs ordered on call to the OR.  To OR when ready.  Review of chart from previous pregnancy notes patient's blood type is B positive, no indication for Rhogam.     Hildred Laser, MD [facility] 01/19/2021

## 2021-01-19 NOTE — Op Note (Signed)
Procedure(s): LAPAROSCOPY DIAGNOSTIC LEFT SALPINGECTOMY WITH REMOVAL OF ECTOPIC Procedure Note  Abigail Johnston female 35 y.o. 01/19/2021  Indications: The patient is a 35 y.o. G82P1011 female with left ectopic pregnancy at 7.[redacted] weeks gestation.   Pre-operative Diagnosis: Left ectopic pregnancy at 7.[redacted] weeks gestation  Post-operative Diagnosis: Same  Surgeon: Hildred Laser, MD  Assistants:  Surgical scrub assist.   Anesthesia: General endotracheal anesthesia,  Anesthesiologist: Piscitello, Cleda Mccreedy, MD  Findings: The uterus was sounded to 10 cm Small hemoperitoneum, ~ 30 cc of fluid present.  Left fallopian tube with ectopic pregnancy, small perforation in posterior surface of the tube with protruding products of conception indicating the beginnings of tubal rupture.  Right fallopian tube and bilateral ovaries appeared normal.  Normal upper abdomen.   Procedure Details: The patient was seen in the Holding Room. The risks, benefits, complications, treatment options, and expected outcomes were discussed with the patient.  The patient concurred with the proposed plan, giving informed consent.  The site of surgery properly noted/marked. The patient was taken to the Operating Room, identified as Abigail Johnston and the procedure verified as Procedure(s) (LRB): LAPAROSCOPY DIAGNOSTIC LEFT SALPINGECTOMY WITH REMOVAL OF ECTOPIC (Left). A Time Out was held and the above information confirmed.  She was then placed under general anesthesia without difficulty. She was placed in the dorsal lithotomy position, and was prepped and draped in a sterile manner. After an adequate timeout was performed, a straight catheterization was performed. A sterile speculum was inserted into the vagina and the cervix was grasped at the anterior lip using a single-toothed tenaculum.  The uterus was sounded to 10 cm, and a Hulka clamp was placed for uterine manipulation.  The speculum and tenaculum were then removed. ,  attention was turned to the abdomen where an umbilical incision was made with the scalpel. The Optiview 11-mm trocar and sleeve were then advanced without difficulty with the laparoscope under direct visualization into the abdomen.  The abdomen was then insufflated with carbon dioxide gas and adequate pneumoperitoneum was obtained.  A survey of the patient's pelvis and abdomen revealed the findings above.  Two 5-mm  bilateral lower quadrant ports were then placed under direct visualization.  The Nezhat suction irrigator was then used to suction the small hemoperitoneum and irrigate the pelvis.  Attention was then turned to the left fallopian tube which was grasped. A small perforation was noted on the posterior surface of the fallopian tube with a fragment of the ectopic protruding through. Attempts were made first to perform a salpingostomy. Approximately 10 cc of Pitressin (10 ml of Vasopressin diluted in 30 ml of NS) was injected into the surface of the fallopian tube, and then cautery was used to extend the perforated area.  The suction irrigator was then used to hydrodissect a plane between the ectopic pregnancy and the fallopian tube, however the fluid pressure was noted to cause a tear in the anterior surface of the uterus with bleeding.  The decision was made to forego salpingostomy and proceed with salpingectomy. The fallopian tube was ligated from the underlying mesosalpinx and uterine attachment using the Harmonic instrument.  Good hemostasis was noted.  The specimen was placed in an EndoCatch bag and removed from the abdomen intact.  The abdomen was desufflated, and all instruments were removed.  The fascial incision of the 11-mm site was reapproximated with a 0 Vicryl figure-of-eight stitch; and all skin incisions were closed with 3-0 Vicryl and Dermabond. The patient tolerated the procedure well.  Sponge, lap, and needle counts were correct times three.  The patient was then taken to the recovery room  awake, extubated and in stable condition.   The patient will be discharged to home as per PACU criteria.  Routine postoperative instructions given.  She was prescribed Percocet, Ibuprofen and Colace.  She will follow up in the office in about 2-3 weeks for postoperative evaluation.  Estimated Blood Loss:  20 ml of surgical blood loss, ~ 30 ml of hemoperitoneum.       Drains: straight catheterization prior to procedure with  20  ml of clear urine         Total IV Fluids: 1170 ml  Specimens: Left fallopian tube with ectopic pregnancy         Implants: None         Complications:  None; patient tolerated the procedure well.         Disposition: PACU - hemodynamically stable.         Condition: stable   Hildred Laser, MD [facility]

## 2021-01-20 ENCOUNTER — Encounter: Payer: Self-pay | Admitting: Obstetrics and Gynecology

## 2021-01-20 MED ORDER — OXYCODONE-ACETAMINOPHEN 5-325 MG PO TABS
1.0000 | ORAL_TABLET | Freq: Once | ORAL | Status: AC
Start: 2021-01-20 — End: 2021-01-20

## 2021-01-20 MED ORDER — OXYCODONE-ACETAMINOPHEN 5-325 MG PO TABS
ORAL_TABLET | ORAL | Status: AC
  Administered 2021-01-20: 2 via ORAL
  Filled 2021-01-20: qty 2

## 2021-01-20 NOTE — Anesthesia Postprocedure Evaluation (Signed)
Anesthesia Post Note  Patient: Abigail Johnston  Procedure(s) Performed: LAPAROSCOPY DIAGNOSTIC LEFT SALPINGECTOMY WITH REMOVAL OF ECTOPIC (Left)  Patient location during evaluation: PACU Anesthesia Type: General Level of consciousness: awake and alert Pain management: pain level controlled Vital Signs Assessment: post-procedure vital signs reviewed and stable Respiratory status: spontaneous breathing, nonlabored ventilation, respiratory function stable and patient connected to nasal cannula oxygen Cardiovascular status: blood pressure returned to baseline and stable Postop Assessment: no apparent nausea or vomiting Anesthetic complications: no   No notable events documented.   Last Vitals:  Vitals:   01/20/21 0015 01/20/21 0020  BP: 90/77 (!) 98/56  Pulse: 94 95  Resp: 16 15  Temp:    SpO2: 98% 98%    Last Pain:  Vitals:   01/20/21 0000  TempSrc:   PainSc: 5                  Cleda Mccreedy Gyanna Jarema

## 2021-01-20 NOTE — Discharge Instructions (Signed)
AMBULATORY SURGERY  DISCHARGE INSTRUCTIONS   The drugs that you were given will stay in your system until tomorrow so for the next 24 hours you should not:  Drive an automobile Make any legal decisions Drink any alcoholic beverage   You may resume regular meals tomorrow.  Today it is better to start with liquids and gradually work up to solid foods.  You may eat anything you prefer, but it is better to start with liquids, then soup and crackers, and gradually work up to solid foods.   Please notify your doctor immediately if you have any unusual bleeding, trouble breathing, redness and pain at the surgery site, drainage, fever, or pain not relieved by medication.       Please contact your physician with any problems or Same Day Surgery at [phone], Monday through Friday 6 am to 4 pm, or [facility] [place] Main number at [phone].  

## 2021-01-23 LAB — SURGICAL PATHOLOGY

## 2021-01-31 ENCOUNTER — Encounter: Payer: Self-pay | Admitting: Obstetrics and Gynecology

## 2021-01-31 ENCOUNTER — Other Ambulatory Visit: Payer: Self-pay

## 2021-01-31 ENCOUNTER — Ambulatory Visit (INDEPENDENT_AMBULATORY_CARE_PROVIDER_SITE_OTHER): Payer: BC Managed Care – PPO | Admitting: Obstetrics and Gynecology

## 2021-01-31 VITALS — BP 89/56 | HR 72 | Ht <= 58 in | Wt 98.3 lb

## 2021-01-31 DIAGNOSIS — Z8759 Personal history of other complications of pregnancy, childbirth and the puerperium: Secondary | ICD-10-CM

## 2021-01-31 DIAGNOSIS — Z9889 Other specified postprocedural states: Secondary | ICD-10-CM

## 2021-01-31 DIAGNOSIS — Z4889 Encounter for other specified surgical aftercare: Secondary | ICD-10-CM

## 2021-01-31 NOTE — Progress Notes (Signed)
    OBSTETRICS/GYNECOLOGY POST-OPERATIVE CLINIC VISIT  Subjective:     Abigail Johnston is a 35 y.o. G67P1011 female who presents to the clinic 1 weeks status post Laparoscopy diagnostic left salpingectomy with removal of ectopic for  ectopic pregnancy . Eating a regular diet without difficulty. Bowel movements are normal. The patient is not having any pain.  Denies any abnormal vaginal bleeding.   The following portions of the patient's history were reviewed and updated as appropriate: allergies, current medications, past family history, past medical history, past social history, past surgical history, and problem list.  Review of Systems A comprehensive review of systems was negative.   Objective:   BP (!) 89/56 (BP Location: Left Arm, Patient Position: Sitting, Cuff Size: Normal)   Pulse 72   Ht 4' 1.5" (1.257 m)   Wt 98 lb 4.8 oz (44.6 kg)   LMP 11/16/2020   BMI 28.21 kg/m  Body mass index is 28.21 kg/m.  General:  alert and no distress  Abdomen: soft, bowel sounds active, non-tender  Incision:   healing well, no drainage, no erythema, no hernia, no seroma, no swelling, no dehiscence, incision well approximated    Pathology:  A.  FALLOPIAN TUBE, LEFT; SALPINGECTOMY:  - ECTOPIC PREGNANCY WITH TUBAL RUPTURE.  - FRAGMENT OF PLACENTAL AND EMBRYONIC TISSUE WHICH APPEARS TO HAVE BEEN  EXTRUDED FROM THE TUBE.   Assessment:   Patient s/p laparoscopic left salpingectomy for ectopic pregnancy.  Doing well postoperatively.   Plan:   1. Continue any current medications as needed, as instructed by provider. 2. Wound care discussed. 3. Operative findings again reviewed. Pathology report discussed. 4. Activity restrictions: no bending, stooping, or squatting and no lifting more than 10-15 pounds 5. Anticipated return to work: now. 6. Unsure about fertility desires right now but will likely initiate birth control at some point. Advised to follow up once decision is made.   Follow  up:  4-6   months  for annual exam.     Hildred Laser, MD [facility]

## 2021-01-31 NOTE — Patient Instructions (Signed)

## 2021-02-08 ENCOUNTER — Encounter: Payer: BC Managed Care – PPO | Admitting: Obstetrics and Gynecology

## 2021-05-07 NOTE — L&D Delivery Note (Signed)
Date of delivery: 02/21/2022 Estimated Date of Delivery: 02/21/22 Patient's last menstrual period was 05/17/2021. EGA: [redacted]w[redacted]d  Delivery Note At 10:16 AM a viable female was delivered via Vaginal, Spontaneous  Presentation: Right Occiput Anterior   APGAR: 4, 8; weight 6 lb 10.9 oz (3030 g).   Placenta status: Spontaneous, Intact.   Cord: 3 vessels with the following complications: None.  Cord pH: NA  Pushing delayed for an hour due to fetal decelerations with trial push. Fetus recovered well with position changes and fluid bolus. Mom pushed with excellent effort to deliver a viable female infant.  The head followed by shoulders, which delivered without difficulty, and the rest of the body.  No nuchal cord noted.  Baby to mom's chest initially and then cord clamped and cut after < 1 min delay for transition assistance with RN/Neo at the warmer. Baby returned to mom skin to skin after recovery. No cord blood obtained.  Placenta delivered spontaneously, intact, with a 3-vessel cord.  First degree perineal laceration repaired with 3-0 Vicryl in standard fashion.  All counts correct.  Hemostasis obtained with IV pitocin and fundal massage. Mom and baby in stable condition. Waiting to hear from Dr Marcelline Mates if patient can be taken to the [facility] today for her salpingectomy. Patient is NPO and has epidural off/intact.  Anesthesia: Epidural Episiotomy: None Lacerations: 1st degree Suture Repair: 3.0 vicryl Est. Blood Loss (mL):  250  Mom to postpartum.  Baby to Couplet care / Skin to Skin.  Rod Can, CNM 02/21/2022, 11:22 AM

## 2021-08-05 ENCOUNTER — Emergency Department: Payer: BC Managed Care – PPO

## 2021-08-05 ENCOUNTER — Emergency Department
Admission: EM | Admit: 2021-08-05 | Discharge: 2021-08-05 | Disposition: A | Discharge status: Home / Self Care | Payer: BC Managed Care – PPO | Attending: Emergency Medicine | Admitting: Emergency Medicine

## 2021-08-05 ENCOUNTER — Other Ambulatory Visit: Payer: Self-pay

## 2021-08-05 DIAGNOSIS — O4690 Antepartum hemorrhage, unspecified, unspecified trimester: Secondary | ICD-10-CM

## 2021-08-05 DIAGNOSIS — O209 Hemorrhage in early pregnancy, unspecified: Secondary | ICD-10-CM | POA: Insufficient documentation

## 2021-08-05 DIAGNOSIS — Z3A12 12 weeks gestation of pregnancy: Secondary | ICD-10-CM | POA: Diagnosis not present

## 2021-08-05 LAB — COMPREHENSIVE METABOLIC PANEL
ALT: 10 U/L (ref 0–44)
AST: 16 U/L (ref 15–41)
Albumin: 3.6 g/dL (ref 3.5–5.0)
Alkaline Phosphatase: 35 U/L — ABNORMAL LOW (ref 38–126)
Anion gap: 7 (ref 5–15)
BUN: 15 mg/dL (ref 6–20)
CO2: 22 mmol/L (ref 22–32)
Calcium: 9 mg/dL (ref 8.9–10.3)
Chloride: 104 mmol/L (ref 98–111)
Creatinine, Ser: 0.6 mg/dL (ref 0.44–1.00)
GFR, Estimated: >60 mL/min (ref >60)
Glucose, Bld: 112 mg/dL — ABNORMAL HIGH (ref 70–99)
Potassium: 3.5 mmol/L (ref 3.5–5.1)
Sodium: 133 mmol/L — ABNORMAL LOW (ref 135–145)
Total Bilirubin: 1.2 mg/dL (ref 0.3–1.2)
Total Protein: 7.3 g/dL (ref 6.5–8.1)

## 2021-08-05 LAB — CBC
HCT: 38.9 % (ref 36.0–46.0)
Hemoglobin: 13.3 g/dL (ref 12.0–15.0)
MCH: 31.9 pg (ref 26.0–34.0)
MCHC: 34.2 g/dL (ref 30.0–36.0)
MCV: 93.3 fL (ref 80.0–100.0)
Platelets: 228 10*3/uL (ref 150–400)
RBC: 4.17 MIL/uL (ref 3.87–5.11)
RDW: 13.4 % (ref 11.5–15.5)
WBC: 7.9 10*3/uL (ref 4.0–10.5)
nRBC: 0 % (ref 0.0–0.2)

## 2021-08-05 LAB — ABO/RH: ABO/RH(D): B POS

## 2021-08-05 LAB — HCG, QUANTITATIVE, PREGNANCY: hCG, Beta Chain, Quant, S: 114912 m[IU]/mL — ABNORMAL HIGH (ref <5)

## 2021-08-05 MED ORDER — METOCLOPRAMIDE HCL 10 MG PO TABS: 10.0000 mg | ORAL_TABLET | Freq: Four times a day (QID) | ORAL | 0 refills | Status: DC | PRN

## 2021-08-05 NOTE — ED Provider Notes (Signed)
? ?  [facility] ?Provider Note ? ? ? Event Date/Time  ? First MD Initiated Contact with Patient 08/05/21 1930   ?  (approximate) ? ?History  ? ?Chief Complaint: Vaginal Bleeding ? ?HPI ? ?Abigail Johnston is a 36 y.o. female with a past medical history of an ectopic pregnancy 6 months ago who presents to the emergency department for vaginal bleeding.  According to the patient she is approximately [redacted] weeks pregnant, several hours ago she stated she noticed "a gush of blood" as well as a clot.  Patient denies any recent intercourse.  Patient denies any abdominal pain or cramping. ? ?Physical Exam  ? ?Triage Vital Signs: ?ED Triage Vitals [08/05/21 1916]  ?Enc Vitals Group  ?   BP 95/62  ?   Pulse Rate 81  ?   Resp 18  ?   Temp 98 ?F (36.7 ?C)  ?   Temp Source Oral  ?   SpO2 93 %  ?   Weight 100 lb (45.4 kg)  ?   Height 4\' 11"  (1.499 m)  ?   Head Circumference   ?   Peak Flow   ?   Pain Score 0  ?   Pain Loc   ?   Pain Edu?   ?   Excl. in GC?   ? ? ?Most recent vital signs: ?Vitals:  ? 08/05/21 1916  ?BP: 95/62  ?Pulse: 81  ?Resp: 18  ?Temp: 98 ?F (36.7 ?C)  ?SpO2: 93%  ? ? ?General: Awake, no distress.  ?CV:  Good peripheral perfusion.  Regular rate and rhythm  ?Resp:  Normal effort.  Equal breath sounds bilaterally.  ?Abd:  No distention.  Soft, nontender.  No rebound or guarding.  Benign abdomen. ? ? ?ED Results / Procedures / Treatments  ? ?RADIOLOGY ? ?IMPRESSION:  ?Eleven week 4 day intrauterine pregnancy. Fetal heart rate 153 beats  ?per minute. Small subchorionic hemorrhage.  ? ? ?MEDICATIONS ORDERED IN ED: ?Medications - No data to display ? ? ?IMPRESSION / MDM / ASSESSMENT AND PLAN / ED COURSE  ?I reviewed the triage vital signs and the nursing notes. ? ?Patient presents the emergency department for vaginal bleeding approximately [redacted] weeks pregnant.  Has not yet seen an OB.  Patient follows up with Dr. 10/05/21 normally.  We will check labs including ABO/Rh, beta hCG and obtain an  ultrasound to further evaluate.  Overall the patient appears well, no distress, reassuring vitals, reassuring physical exam with a benign abdomen. ? ?I reviewed the patient's operative note from 01/19/2021 at which time patient had a left-sided ectopic pregnancy at 7.[redacted] weeks gestation ? ?Patient's work-up shows an 11-week 4-day intrauterine pregnancy with a normal heart rate of 153 bpm and a small subchorionic hemorrhage.  Suspect that the subchorionic hemorrhage was the cause of the patient's vaginal bleeding.  Beta hCG of 114,000.  B+ blood type.  Normal chemistry and a reassuring CBC.  Given the patient's reassuring work-up I believe the patient will be safe for discharge home and routine outpatient OB follow-up.  Patient agreeable to plan.  Discussed subchorionic hematoma and expected course. ? ?FINAL CLINICAL IMPRESSION(S) / ED DIAGNOSES  ? ?Threatened miscarriage ? ? ?Note:  This document was prepared using Dragon voice recognition software and may include unintentional dictation errors. ?  01/21/2021, MD ?08/05/21 2131 ? ?

## 2021-08-05 NOTE — ED Triage Notes (Signed)
Ambulatory to triage with c/o vaginal bleeding x 2 hours. Currently [redacted] weeks pregnant, G3P1. Reports rush of bright red blood with noted tissue and clots present and then amount of blood became more scant. Denies pain.  ?

## 2021-08-08 ENCOUNTER — Encounter: Payer: Self-pay | Admitting: Obstetrics and Gynecology

## 2021-08-08 ENCOUNTER — Ambulatory Visit: Payer: BC Managed Care – PPO | Admitting: Obstetrics and Gynecology

## 2021-08-08 VITALS — BP 90/49 | HR 94 | Resp 16 | Ht 59.5 in | Wt 99.0 lb

## 2021-08-08 DIAGNOSIS — Z113 Encounter for screening for infections with a predominantly sexual mode of transmission: Secondary | ICD-10-CM | POA: Diagnosis not present

## 2021-08-08 DIAGNOSIS — Z1379 Encounter for other screening for genetic and chromosomal anomalies: Secondary | ICD-10-CM | POA: Diagnosis not present

## 2021-08-08 DIAGNOSIS — O200 Threatened abortion: Secondary | ICD-10-CM | POA: Diagnosis not present

## 2021-08-08 DIAGNOSIS — Z3A11 11 weeks gestation of pregnancy: Secondary | ICD-10-CM | POA: Diagnosis not present

## 2021-08-08 NOTE — Progress Notes (Signed)
? ? ?GYNECOLOGY CLINIC PROGRESS NOTE ?Subjective:  ? ? Abigail Johnston is a 36 y.o. G37P1011 female who presents for f/u from Emergency Room visit on 08/05/2021 due to vaginal bleeding in early pregnancy. Of note, patient is s/p surgical treatment of left ectopic pregnancy 6 months ago. Reports having one day of moderate bleeding with passage of a large clot. Since then has only had light spotting. She is not in any acute distress today. Reports pregnancy was unplanned, but is desired. Ectopic risks: previous ectopic.  ? ?Ultrasound performed at ER visit notes:  ? ?IMPRESSION: ?Eleven week 4 day intrauterine pregnancy. Fetal heart rate 153 beats ?per minute. Small subchorionic hemorrhage. ?  ? ? ?The following portions of the patient's history were reviewed and updated as appropriate: allergies, current medications, past family history, past medical history, past social history, past surgical history, and problem list. ? ?Review of Systems ?Pertinent items noted in HPI and remainder of comprehensive ROS otherwise negative.  ? ?Objective:  ?  ?Blood pressure (!) 90/49, pulse 94, resp. rate 16, height 4' 11.5" (1.511 m), weight 99 lb (44.9 kg), last menstrual period 05/17/2021, currently breastfeeding.  Body mass index is 19.66 kg/m?. ?Gen App: NAD ?Abd: soft, nontender.  Unable to identify FHT with Dopplers.  ? ? ?Imaging ?Limited office ultrasound: FHT present, 154 bpm   ? ? ? ?Labs:  ?Admission on 08/05/2021, Discharged on 08/05/2021  ?Component Date Value Ref Range Status  ? WBC 08/05/2021 7.9  4.0 - 10.5 K/uL Final  ? RBC 08/05/2021 4.17  3.87 - 5.11 MIL/uL Final  ? Hemoglobin 08/05/2021 13.3  12.0 - 15.0 g/dL Final  ? HCT 47/82/9562 38.9  36.0 - 46.0 % Final  ? MCV 08/05/2021 93.3  80.0 - 100.0 fL Final  ? MCH 08/05/2021 31.9  26.0 - 34.0 pg Final  ? MCHC 08/05/2021 34.2  30.0 - 36.0 g/dL Final  ? RDW 13/12/6576 13.4  11.5 - 15.5 % Final  ? Platelets 08/05/2021 228  150 - 400 K/uL Final  ? nRBC 08/05/2021 0.0   0.0 - 0.2 % Final  ? Performed at [facility], [address], [place], [place] [address]  ? Sodium 08/05/2021 133 (L)  135 - 145 mmol/L Final  ? Potassium 08/05/2021 3.5  3.5 - 5.1 mmol/L Final  ? Chloride 08/05/2021 104  98 - 111 mmol/L Final  ? CO2 08/05/2021 22  22 - 32 mmol/L Final  ? Glucose, Bld 08/05/2021 112 (H)  70 - 99 mg/dL Final  ? Glucose reference range applies only to samples taken after fasting for at least 8 hours.  ? BUN 08/05/2021 15  6 - 20 mg/dL Final  ? Creatinine, Ser 08/05/2021 0.60  0.44 - 1.00 mg/dL Final  ? Calcium 95/28/4132 9.0  8.9 - 10.3 mg/dL Final  ? Total Protein 08/05/2021 7.3  6.5 - 8.1 g/dL Final  ? Albumin 44/05/270 3.6  3.5 - 5.0 g/dL Final  ? AST 53/66/4403 16  15 - 41 U/L Final  ? ALT 08/05/2021 10  0 - 44 U/L Final  ? Alkaline Phosphatase 08/05/2021 35 (L)  38 - 126 U/L Final  ? Total Bilirubin 08/05/2021 1.2  0.3 - 1.2 mg/dL Final  ? GFR, Estimated 08/05/2021 >60  >60 mL/min Final  ? Comment: (NOTE) ?Calculated using the CKD-EPI Creatinine Equation (2021) ?  ? Anion gap 08/05/2021 7  5 - 15 Final  ? Performed at [facility], [address] [place], [place] [address]  ?  ABO/RH(D) 08/05/2021    Final  ?                 Value:B POS ?Performed at [facility], [address] [place], [place] [address] ?  ? hCG, Newman Nickels 08/05/[phone] (H)  <5 mIU/mL Final  ? Comment:        ?  GEST. AGE      CONC.  (mIU/mL) ?  <=1 WEEK        5 - 50 ?    2 WEEKS       50 - 500 ?    3 WEEKS       100 - 10,000 ?    4 WEEKS     1,000 - 30,000 ?    5 WEEKS     3,500 - 115,000 ?  6-8 WEEKS     12,000 - 270,000 ?   12 WEEKS     15,000 - 220,000 ?       ?FEMALE AND NON-PREGNANT FEMALE: ?    LESS THAN 5 mIU/mL ?Performed at [facility], [address] [place], [place] [address] ?  ? ? ? ?Imaging:  ?US OB LESS THAN 14 WEEKS WITH OB TRANSVAGINAL ?CLINICAL DATA:  Vaginal ? ?EXAM: ?OBSTETRIC <14 WK [place] AND TRANSVAGINAL OB US ? ?TECHNIQUE: ?Both  transabdominal and transvaginal ultrasound examinations were ?performed for complete evaluation of the gestation as well as the ?maternal uterus, adnexal regions, and pelvic cul-de-sac. ?Transvaginal technique was performed to assess early pregnancy. ? ?COMPARISON:  None. ? ?FINDINGS: ?Intrauterine gestational sac: Single ? ?Yolk sac:  Visualized. ? ?Embryo:  Visualized. ? ?Cardiac Activity: Visualized. ? ?Heart Rate: 153 bpm ? ?MSD:   mm    w     d ? ?CRL:  48 mm   11 w   4 d                  [place] EDC: 04/22/2022 ? ?Subchorionic hemorrhage:  Small subchorionic hemorrhage ? ?Maternal uterus/adnexae: No adnexal mass or free fluid. ? ?IMPRESSION: ?Eleven week 4 day intrauterine pregnancy. Fetal heart rate 153 beats ?per minute. Small subchorionic hemorrhage. ? ?Electronically Signed ?  By: Charlett Nose M.D. ?  On: 08/05/2021 21:06 ? ? ?Assessment:  ? ? IUP at 11.[redacted] weeks gestation ? Threatened miscarriage.  ? ?Plan:  ? ?- Warning signs discussed: to call for increased bleeding, abdominal or shoulder pain, light headedness, or if she has any concerns. ?- Pregnancy at 11.[redacted] weeks gestation, [facility]: 02/21/2022 by LMP (consistent with recent sono). Briefly discussed pre-natal care options. Encouraged well-balanced diet, plenty of rest when needed, pre-natal vitamins daily and walking for exercise. Discussed self-help for nausea, avoiding OTC medications until consulting provider or pharmacist, other than Tylenol as needed, minimal caffeine (1-2 cups daily) and avoiding alcohol. NOB intake performed today. She will schedule her initial OB visit in the next  2-3 weeks.  Feel free to call with any questions.  ? ? ?Hildred Laser, MD ?[facility] ? ? ?

## 2021-08-08 NOTE — Progress Notes (Signed)
[name] presents for NOB nurse interview visit. Pregnancy confirmation done 08/05/2021 at the ED.  [id number]. Pregnancy education material explained and given. No cats in the home. NOB labs ordered. Sickle cell ordered. HIV labs and Drug screen were explained optional and she did not decline. Drug screen ordered. PNV encouraged. Genetic screening options discussed. Genetic testing: Ordered.  Financial policy reviewed. FMLA form reviewed and signed. Patient to follow up with [name]  in __ weeks for NOB physical.  All questions answered. ? ? ?

## 2021-08-09 LAB — MICROSCOPIC EXAMINATION
Casts: NONE SEEN /lpf
Epithelial Cells (non renal): >10 /hpf — AB (ref 0–10)
WBC, UA: NONE SEEN /hpf (ref 0–5)

## 2021-08-09 LAB — URINALYSIS, ROUTINE W REFLEX MICROSCOPIC
Bilirubin, UA: NEGATIVE
Glucose, UA: NEGATIVE
Ketones, UA: NEGATIVE
Leukocytes,UA: NEGATIVE
Nitrite, UA: NEGATIVE
Protein,UA: NEGATIVE
Specific Gravity, UA: >=1.03 — AB (ref 1.005–1.030)
Urobilinogen, Ur: 0.2 mg/dL (ref 0.2–1.0)
pH, UA: 6 (ref 5.0–7.5)

## 2021-08-09 LAB — GC/CHLAMYDIA PROBE AMP
Chlamydia trachomatis, NAA: NEGATIVE
Neisseria Gonorrhoeae by PCR: NEGATIVE

## 2021-08-10 LAB — PAIN MGT SCRN (14 DRUGS), UR
Amphetamine Scrn, Ur: NEGATIVE ng/mL
BARBITURATE SCREEN URINE: NEGATIVE ng/mL
BENZODIAZEPINE SCREEN, URINE: NEGATIVE ng/mL
Buprenorphine, Urine: NEGATIVE ng/mL
CANNABINOIDS UR QL SCN: NEGATIVE ng/mL
Cocaine (Metab) Scrn, Ur: NEGATIVE ng/mL
Creatinine(Crt), U: 128 mg/dL (ref 20.0–300.0)
Fentanyl, Urine: NEGATIVE pg/mL
Meperidine Screen, Urine: NEGATIVE ng/mL
Methadone Screen, Urine: NEGATIVE ng/mL
OXYCODONE+OXYMORPHONE UR QL SCN: NEGATIVE ng/mL
Opiate Scrn, Ur: NEGATIVE ng/mL
Ph of Urine: 6 (ref 4.5–8.9)
Phencyclidine Qn, Ur: NEGATIVE ng/mL
Propoxyphene Scrn, Ur: NEGATIVE ng/mL
Tramadol Screen, Urine: NEGATIVE ng/mL

## 2021-08-10 LAB — RUBELLA SCREEN: Rubella Antibodies, IGG: 4.42 {index}

## 2021-08-10 LAB — VIRAL HEPATITIS HBV, HCV
HCV Ab: NONREACTIVE
Hep B Core Total Ab: NEGATIVE
Hep B Surface Ab, Qual: REACTIVE
Hepatitis B Surface Ag: NEGATIVE

## 2021-08-10 LAB — PARVOVIRUS B19 ANTIBODY, IGG AND IGM
Parvovirus B19 IgG: 0.3 index (ref 0.0–0.8)
Parvovirus B19 IgM: 0.1 index (ref 0.0–0.8)

## 2021-08-10 LAB — VARICELLA ZOSTER ANTIBODY, IGG: Varicella zoster IgG: 1123 index (ref >165)

## 2021-08-10 LAB — NICOTINE SCREEN, URINE: Cotinine Ql Scrn, Ur: NEGATIVE ng/mL

## 2021-08-10 LAB — ANTIBODY SCREEN: Antibody Screen: NEGATIVE

## 2021-08-10 LAB — CULTURE, OB URINE

## 2021-08-10 LAB — ABO AND RH: Rh Factor: POSITIVE

## 2021-08-10 LAB — URINE CULTURE, OB REFLEX

## 2021-08-10 LAB — RPR: RPR Ser Ql: NONREACTIVE

## 2021-08-10 LAB — HGB SOLU + RFLX FRAC: Sickle Solubility Test - HGBRFX: NEGATIVE

## 2021-08-10 LAB — HIV ANTIBODY (ROUTINE TESTING W REFLEX): HIV Screen 4th Generation wRfx: NONREACTIVE

## 2021-08-10 LAB — HCV INTERPRETATION

## 2021-08-13 LAB — MATERNIT21  PLUS CORE+ESS+SCA, BLOOD
11q23 deletion (Jacobsen): NOT DETECTED
15q11 deletion (PW Angelman): NOT DETECTED
1p36 deletion syndrome: NOT DETECTED
22q11 deletion (DiGeorge): NOT DETECTED
4p16 deletion(Wolf-Hirschhorn): NOT DETECTED
5p15 deletion (Cri-du-chat): NOT DETECTED
8q24 deletion (Langer-Giedion): NOT DETECTED
Fetal Fraction: 14
Monosomy X (Turner Syndrome): NOT DETECTED
Result (T21): NEGATIVE
Trisomy 13 (Patau syndrome): NEGATIVE
Trisomy 16: NOT DETECTED
Trisomy 18 (Edwards syndrome): NEGATIVE
Trisomy 21 (Down syndrome): NEGATIVE
Trisomy 22: NOT DETECTED
XXX (Triple X Syndrome): NOT DETECTED
XXY (Klinefelter Syndrome): NOT DETECTED
XYY (Jacobs Syndrome): NOT DETECTED

## 2021-08-25 ENCOUNTER — Telehealth: Payer: Self-pay | Admitting: Obstetrics and Gynecology

## 2021-08-25 NOTE — Telephone Encounter (Signed)
Abigail Johnston is asking if it is safe for her to travel on a cruise to the [place] in July. Pt said it is ok to respond back via my chart.  ?

## 2021-08-30 ENCOUNTER — Encounter: Payer: Self-pay | Admitting: Obstetrics and Gynecology

## 2021-08-30 ENCOUNTER — Other Ambulatory Visit (HOSPITAL_COMMUNITY)
Admission: RE | Admit: 2021-08-30 | Discharge: 2021-08-30 | Disposition: A | Discharge status: Home / Self Care | Payer: BC Managed Care – PPO | Source: Ambulatory Visit | Attending: Obstetrics and Gynecology | Admitting: Obstetrics and Gynecology

## 2021-08-30 ENCOUNTER — Ambulatory Visit (INDEPENDENT_AMBULATORY_CARE_PROVIDER_SITE_OTHER): Payer: BC Managed Care – PPO | Admitting: Obstetrics and Gynecology

## 2021-08-30 VITALS — BP 78/45 | HR 78 | Ht 59.5 in | Wt 100.0 lb

## 2021-08-30 DIAGNOSIS — Z3A15 15 weeks gestation of pregnancy: Secondary | ICD-10-CM

## 2021-08-30 DIAGNOSIS — O09522 Supervision of elderly multigravida, second trimester: Secondary | ICD-10-CM

## 2021-08-30 DIAGNOSIS — Z124 Encounter for screening for malignant neoplasm of cervix: Secondary | ICD-10-CM | POA: Insufficient documentation

## 2021-08-30 DIAGNOSIS — I950 Idiopathic hypotension: Secondary | ICD-10-CM

## 2021-08-30 DIAGNOSIS — Z3689 Encounter for other specified antenatal screening: Secondary | ICD-10-CM

## 2021-08-30 DIAGNOSIS — R519 Headache, unspecified: Secondary | ICD-10-CM

## 2021-08-30 DIAGNOSIS — O26892 Other specified pregnancy related conditions, second trimester: Secondary | ICD-10-CM

## 2021-08-30 DIAGNOSIS — O09523 Supervision of elderly multigravida, third trimester: Secondary | ICD-10-CM | POA: Insufficient documentation

## 2021-08-30 LAB — POCT URINALYSIS DIPSTICK OB
Bilirubin, UA: NEGATIVE
Blood, UA: NEGATIVE
Glucose, UA: NEGATIVE
Ketones, UA: NEGATIVE
Leukocytes, UA: NEGATIVE
Nitrite, UA: NEGATIVE
Spec Grav, UA: 1.015 (ref 1.010–1.025)
Urobilinogen, UA: 0.2 E.U./dL
pH, UA: 7.5 (ref 5.0–8.0)

## 2021-08-30 NOTE — Progress Notes (Signed)
? ?OBSTETRIC INITIAL PRENATAL VISIT ? ?Subjective:  ? ? Abigail Johnston is being seen today for her first obstetrical visit.  This is not a planned pregnancy. She is a 36 y.o. G43P1011 female at [redacted]w[redacted]d gestation, Estimated Date of Delivery: 02/21/22 with Patient's last menstrual period was 05/17/2021.,  consistent with 11 week sono. Her obstetrical history is significant for advanced maternal age. Relationship with FOB: spouse, living together. Patient is unsure of intentions to breast feed. Pregnancy history fully reviewed. ? ? ?OB History  ?Gravida Para Term Preterm AB Living  ?3 1 1  0 1 1  ?SAB IAB Ectopic Multiple Live Births  ?0 0 0 0 1  ?  ?# Outcome Date GA Lbr Len/2nd Weight Sex Delivery Anes PTL Lv  ?3 Current           ?2 Term 10/04/19 [redacted]w[redacted]d  5 lb 1.8 oz (2.32 kg) F Vag-Vacuum EPI  LIV  ?   Name: Sotelo,GIRL Osie  ?   Apgar1: 8  Apgar5: 9  ?1 AB 2012          ? ? ?Gynecologic History:  ?Last pap smear was 04/09/2019.  Results were Normal.  Denies h/o abnormal pap smears in the past.  ?Denies history of STIs.  ?Contraception prior to conception: None ? ? ?Past Medical History:  ?Diagnosis Date  ? Headache   ? Migraine headache with aura   ? ? ?Family History  ?Problem Relation Age of Onset  ? Healthy Mother   ? Healthy Father   ? Breast cancer Maternal Grandmother   ?     deceased in late 86s  ? Cancer Maternal Uncle   ?     unknown type  ? ? ?Past Surgical History:  ?Procedure Laterality Date  ? LAPAROSCOPY Left 01/19/2021  ? Procedure: LAPAROSCOPY DIAGNOSTIC LEFT SALPINGECTOMY WITH REMOVAL OF ECTOPIC;  Surgeon: 01/21/2021, MD;  Location: [facility] ORS;  Service: Gynecology;  Laterality: Left;  ? WISDOM TOOTH EXTRACTION    ? ? ?Social History  ? ?Socioeconomic History  ? Marital status: Single  ?  Spouse name: Not on file  ? Number of children: Not on file  ? Years of education: Not on file  ? Highest education level: Not on file  ?Occupational History  ? Not on file  ?Tobacco Use  ? Smoking status: Never   ?  Passive exposure: Never  ? Smokeless tobacco: Never  ?Vaping Use  ? Vaping Use: Never used  ?Substance and Sexual Activity  ? Alcohol use: No  ? Drug use: No  ? Sexual activity: Not Currently  ?  Birth control/protection: None  ?Other Topics Concern  ? Not on file  ?Social History Narrative  ? Not on file  ? ?Social Determinants of Health  ? ?Financial Resource Strain: Not on file  ?Food Insecurity: Not on file  ?Transportation Needs: Not on file  ?Physical Activity: Not on file  ?Stress: Not on file  ?Social Connections: Not on file  ?Intimate Partner Violence: Not on file  ? ? ?No current outpatient medications on file prior to visit.  ? ?No current facility-administered medications on file prior to visit.  ? ? ?No Known Allergies ? ? ?Review of Systems ?General: Not Present- Fever, Weight Loss and Weight Gain. ?Skin: Not Present- Rash. ?HEENT: Not Present- Blurred Vision, Headache and Bleeding Gums. ?Respiratory: Not Present- Difficulty Breathing. ?Breast: Not Present- Breast Mass. ?Cardiovascular: Not Present- Chest Pain, Elevated Blood Pressure, Fainting / Blacking Out and Shortness of  Breath. ?Gastrointestinal: Not Present- Abdominal Pain, Constipation, Nausea and Vomiting. ?Female Genitourinary: Not Present- Frequency, Painful Urination, Pelvic Pain, Vaginal Bleeding, Vaginal Discharge, Contractions, regular, Fetal Movements Decreased, Urinary Complaints and Vaginal Fluid. ?Musculoskeletal: Not Present- Back Pain and Leg Cramps. ?Neurological: Not Present- Dizziness.  Positive for headaches.  ?Psychiatric: Not Present- Depression.  ? ?  ?Objective:  ? ?Blood pressure (!) 78/45, pulse 78, weight 100 lb (45.4 kg), last menstrual period 05/17/2021, currently breastfeeding. Body mass index is 19.86 kg/m?. ? ?General Appearance:    Alert, cooperative, no distress, appears stated age  ?Head:    Normocephalic, without obvious abnormality, atraumatic  ?Eyes:    PERRL, conjunctiva/corneas clear, EOM's intact, both  eyes  ?Ears:    Normal external ear canals, both ears  ?Nose:   Nares normal, septum midline, mucosa normal, no drainage or sinus tenderness  ?Throat:   Lips, mucosa, and tongue normal; teeth and gums normal  ?Neck:   Supple, symmetrical, trachea midline, no adenopathy; thyroid: no enlargement/tenderness/nodules; no carotid bruit or JVD  ?Back:     Symmetric, no curvature, ROM normal, no CVA tenderness  ?Lungs:     Clear to auscultation bilaterally, respirations unlabored  ?Chest Wall:    No tenderness or deformity  ? Heart:    Regular rate and rhythm, S1 and S2 normal, no murmur, rub or gallop  ?Breast Exam:    No tenderness, masses, or nipple abnormality  ?Abdomen:     Soft, non-tender, bowel sounds active all four quadrants, no masses, no organomegaly.  FHT 152  bpm.  ?Genitalia:    Pelvic:external genitalia normal, vagina without lesions, discharge, or tenderness, rectovaginal septum  normal. Cervix normal in appearance, no cervical motion tenderness, no adnexal masses or tenderness.  Pregnancy positive findings: uterine enlargement: 15 wk size, nontender.   ?Rectal:    Normal external sphincter.  No hemorrhoids appreciated. Internal exam not done.   ?Extremities:   Extremities normal, atraumatic, no cyanosis or edema  ?Pulses:   2+ and symmetric all extremities  ?Skin:   Skin color, texture, turgor normal, no rashes or lesions  ?Lymph nodes:   Cervical, supraclavicular, and axillary nodes normal  ?Neurologic:   CNII-XII intact, normal strength, sensation and reflexes throughout  ? ?  ?Assessment:  ? ?1. Supervision of elderly multigravida (>=83 years old at time of delivery), second trimester   ?2. [redacted] weeks gestation of pregnancy   ?3. Encounter for fetal anatomic survey   ?4. Idiopathic hypotension   ?5. Cervical cancer screening   ?6. Pregnancy headache in second trimester   ? ? ?Plan:  ? ?Supervision of elderly multigravida in second trimester ?- Initial labs reviewed. CBC ordered.  ?- Prenatal vitamins  encouraged. ?- Problem list reviewed and updated. ?- New OB counseling:  The patient has been given an overview regarding routine prenatal care.  Recommendations regarding diet, weight gain, and exercise in pregnancy were given. ?- Prenatal testing, optional genetic testing, and ultrasound use in pregnancy were reviewed.  Traditional genetic screening vs cell-fee DNA genetic screening discussed, including risks and benefits. Testing results reviewed, normal [id number].  For AFP next visit.  ?- Benefits of Breast Feeding were discussed. The patient is encouraged to consider nursing her baby post partum. ?- Anatomy scan ordered for next visit.  ? ?2. Idiopathic hypotension ?- Continue to monitor. Currently asymptomatic.  Given precautions on hypotension and fainting in pregnancy.  Encourage adequate fluid intake. ? ?3. Cervical cancer screening ?- Pap smear performed today ? ?4. Headaches in  pregnancy ? - Patient has a history of headaches and migraines, has not had a migraine in several years however headaches have become more frequent since the onset of pregnancy.  Also notes that she has recently cut back on her caffeine so this may be contributing to her headaches.  I discussed management options for her headache, right now patient desires take medication if possible, however I discussed with her that if she continues to experience headaches in the become more severe that medication could be prescribed.  Patient notes understanding. ?Follow up in 4 weeks. ? ? ? ?Hildred Laserherry, Isra Lindy, MD ?[facility] ? ? ?

## 2021-08-30 NOTE — Patient Instructions (Signed)

## 2021-08-30 NOTE — Progress Notes (Signed)
NOB Physical: ?

## 2021-09-01 LAB — CYTOLOGY - PAP: Diagnosis: NEGATIVE

## 2021-09-07 ENCOUNTER — Encounter: Payer: BC Managed Care – PPO | Admitting: Obstetrics and Gynecology

## 2021-10-03 ENCOUNTER — Ambulatory Visit (INDEPENDENT_AMBULATORY_CARE_PROVIDER_SITE_OTHER): Payer: BC Managed Care – PPO

## 2021-10-03 ENCOUNTER — Ambulatory Visit (INDEPENDENT_AMBULATORY_CARE_PROVIDER_SITE_OTHER): Payer: BC Managed Care – PPO | Admitting: Obstetrics and Gynecology

## 2021-10-03 ENCOUNTER — Encounter: Payer: Self-pay | Admitting: Obstetrics and Gynecology

## 2021-10-03 VITALS — BP 87/57 | HR 94 | Wt 106.2 lb

## 2021-10-03 DIAGNOSIS — O09522 Supervision of elderly multigravida, second trimester: Secondary | ICD-10-CM | POA: Diagnosis not present

## 2021-10-03 DIAGNOSIS — Z3689 Encounter for other specified antenatal screening: Secondary | ICD-10-CM

## 2021-10-03 DIAGNOSIS — I950 Idiopathic hypotension: Secondary | ICD-10-CM | POA: Diagnosis not present

## 2021-10-03 DIAGNOSIS — Z1379 Encounter for other screening for genetic and chromosomal anomalies: Secondary | ICD-10-CM

## 2021-10-03 DIAGNOSIS — Z3A19 19 weeks gestation of pregnancy: Secondary | ICD-10-CM

## 2021-10-03 LAB — POCT URINALYSIS DIPSTICK OB
Bilirubin, UA: NEGATIVE
Blood, UA: NEGATIVE
Glucose, UA: NEGATIVE
Ketones, UA: NEGATIVE
Leukocytes, UA: NEGATIVE
Nitrite, UA: NEGATIVE
POC,PROTEIN,UA: NEGATIVE
Spec Grav, UA: 1.02 (ref 1.010–1.025)
Urobilinogen, UA: 0.2 E.U./dL
pH, UA: 6.5 (ref 5.0–8.0)

## 2021-10-03 NOTE — Progress Notes (Signed)
ROB: No issues- feels well.  Now feeling baby move daily.  FAS ultrasound later today.

## 2021-10-03 NOTE — Progress Notes (Signed)
ROB. Patient states fetal movement with occasional pressure. Ultrasound scheduled for after visit. Patient states no questions or concerns at this time.

## 2021-10-07 LAB — AFP, SERUM, OPEN SPINA BIFIDA
AFP MoM: 0.7
AFP Value: 56.4 ng/mL
Gest. Age on Collection Date: 19.9 weeks
Maternal Age At EDD: 37.1 yr
OSBR Risk 1 IN: 10000
Test Results:: NEGATIVE
Weight: 106 [lb_av]

## 2021-11-02 ENCOUNTER — Ambulatory Visit (INDEPENDENT_AMBULATORY_CARE_PROVIDER_SITE_OTHER): Payer: BC Managed Care – PPO | Admitting: Obstetrics and Gynecology

## 2021-11-02 VITALS — BP 85/52 | HR 84 | Wt 108.0 lb

## 2021-11-02 DIAGNOSIS — R519 Headache, unspecified: Secondary | ICD-10-CM

## 2021-11-02 DIAGNOSIS — Z131 Encounter for screening for diabetes mellitus: Secondary | ICD-10-CM

## 2021-11-02 DIAGNOSIS — O26892 Other specified pregnancy related conditions, second trimester: Secondary | ICD-10-CM

## 2021-11-02 DIAGNOSIS — Z3A24 24 weeks gestation of pregnancy: Secondary | ICD-10-CM

## 2021-11-02 DIAGNOSIS — O09522 Supervision of elderly multigravida, second trimester: Secondary | ICD-10-CM

## 2021-11-02 LAB — POCT URINALYSIS DIPSTICK OB
Glucose, UA: NEGATIVE
POC,PROTEIN,UA: NEGATIVE

## 2021-11-02 MED ORDER — BUTALBITAL-APAP-CAFFEINE 50-325-40 MG PO CAPS: 1.0000 | ORAL_CAPSULE | Freq: Four times a day (QID) | ORAL | 3 refills | Status: DC | PRN

## 2021-11-02 NOTE — Progress Notes (Signed)
ROB: Patient complains of headaches that last all day.  Has not taken Tylenol due to concern for taking medications. Does have a h/o migraines in the past. Discussed safety of Tylenol, also discussed other non-medication options. Lastly will give prescription for Fioricet if all other methods fail. Patient desires info on BTL.  Briefly discussed all options for contraception. RTC in 4 weeks for week labs then. Discussed practice changes, midwifery as first call. Will meet midwives in third trimester.

## 2021-11-02 NOTE — Progress Notes (Signed)
No vb. No lof. Pt still having a lot of HA's that last all day. Pt has not tried tylenol, has not wanted to take any medciation

## 2021-11-28 ENCOUNTER — Other Ambulatory Visit: Payer: BC Managed Care – PPO

## 2021-11-28 ENCOUNTER — Ambulatory Visit (INDEPENDENT_AMBULATORY_CARE_PROVIDER_SITE_OTHER): Payer: BC Managed Care – PPO | Admitting: Obstetrics and Gynecology

## 2021-11-28 ENCOUNTER — Encounter: Payer: Self-pay | Admitting: Obstetrics and Gynecology

## 2021-11-28 VITALS — BP 85/54 | HR 80 | Wt 111.2 lb

## 2021-11-28 DIAGNOSIS — Z131 Encounter for screening for diabetes mellitus: Secondary | ICD-10-CM

## 2021-11-28 DIAGNOSIS — Z23 Encounter for immunization: Secondary | ICD-10-CM | POA: Diagnosis not present

## 2021-11-28 DIAGNOSIS — O09522 Supervision of elderly multigravida, second trimester: Secondary | ICD-10-CM

## 2021-11-28 DIAGNOSIS — Z3A27 27 weeks gestation of pregnancy: Secondary | ICD-10-CM

## 2021-11-28 LAB — POCT URINALYSIS DIPSTICK OB
Bilirubin, UA: NEGATIVE
Blood, UA: NEGATIVE
Glucose, UA: NEGATIVE
Ketones, UA: NEGATIVE
Leukocytes, UA: NEGATIVE
Nitrite, UA: NEGATIVE
Spec Grav, UA: 1.02 (ref 1.010–1.025)
Urobilinogen, UA: 0.2 E.U./dL
pH, UA: 6.5 (ref 5.0–8.0)

## 2021-11-28 NOTE — Progress Notes (Signed)
ROB: 1 hour GCT today.  Patient remains somewhat hypotensive without symptoms.  Reports daily fetal movement.  Taking vitamins as directed.  Headaches have resolved without Fioricet use.  Considering permanent sterilization as a form of birth control but is not sure. (Requested regarding the possibility of complete tubal removal versus tying or clipping. -Also patient only has 1 fallopian tube).  Use of LARC discussed.

## 2021-11-28 NOTE — Progress Notes (Signed)
ROB. Patient states fetal movement with increased pressure. She states no headaches since last visit, has not had to use Fioricet yet. Patient completed GCT, received TDAP injection and signed BTC today. Patient states no questions or concerns at this time.

## 2021-11-29 LAB — CBC
Hematocrit: 33.2 % — ABNORMAL LOW (ref 34.0–46.6)
Hemoglobin: 11.2 g/dL (ref 11.1–15.9)
MCH: 30.9 pg (ref 26.6–33.0)
MCHC: 33.7 g/dL (ref 31.5–35.7)
MCV: 92 fL (ref 79–97)
Platelets: 199 10*3/uL (ref 150–450)
RBC: 3.62 x10E6/uL — ABNORMAL LOW (ref 3.77–5.28)
RDW: 12.3 % (ref 11.7–15.4)
WBC: 6.6 10*3/uL (ref 3.4–10.8)

## 2021-11-29 LAB — GLUCOSE, 1 HOUR GESTATIONAL: Gestational Diabetes Screen: 114 mg/dL (ref 70–139)

## 2021-11-29 LAB — RPR: RPR Ser Ql: NONREACTIVE

## 2021-12-13 ENCOUNTER — Ambulatory Visit (INDEPENDENT_AMBULATORY_CARE_PROVIDER_SITE_OTHER): Payer: BC Managed Care – PPO | Admitting: Certified Nurse Midwife

## 2021-12-13 ENCOUNTER — Encounter: Payer: Self-pay | Admitting: Certified Nurse Midwife

## 2021-12-13 VITALS — BP 98/62 | HR 91 | Wt 110.4 lb

## 2021-12-13 DIAGNOSIS — Z3A30 30 weeks gestation of pregnancy: Secondary | ICD-10-CM

## 2021-12-13 NOTE — Progress Notes (Signed)
ROB doing well, denies any headaches and has not had to use the Fioricet that was prescribed to her. She is feeling good movement. Fetal movement discussed. Reviewed sign & symptoms PTL. Follow up 2 wk with Missy for ROB.   Doreene Burke, CNM

## 2021-12-13 NOTE — Patient Instructions (Signed)
[facility]  [facility]  [address], [place], [place] [address]  Phone: [phone]  [facility] (second location)  [address] [place], [place] [address]  Phone: [phone]  [facility] ([place]) [address], [place], [place] [address] Phone: [phone]  [facility]  [address] [place], [place] [address]  Phone: [phone] 

## 2021-12-19 ENCOUNTER — Encounter: Payer: Self-pay | Admitting: Obstetrics and Gynecology

## 2021-12-19 ENCOUNTER — Ambulatory Visit (INDEPENDENT_AMBULATORY_CARE_PROVIDER_SITE_OTHER): Payer: BC Managed Care – PPO | Admitting: Obstetrics and Gynecology

## 2021-12-19 VITALS — BP 86/63 | HR 94 | Wt 112.9 lb

## 2021-12-19 DIAGNOSIS — Z3403 Encounter for supervision of normal first pregnancy, third trimester: Secondary | ICD-10-CM

## 2021-12-19 DIAGNOSIS — M5431 Sciatica, right side: Secondary | ICD-10-CM

## 2021-12-19 DIAGNOSIS — M5432 Sciatica, left side: Secondary | ICD-10-CM

## 2021-12-19 DIAGNOSIS — O09523 Supervision of elderly multigravida, third trimester: Secondary | ICD-10-CM

## 2021-12-19 DIAGNOSIS — Z3A30 30 weeks gestation of pregnancy: Secondary | ICD-10-CM

## 2021-12-19 DIAGNOSIS — O2653 Maternal hypotension syndrome, third trimester: Secondary | ICD-10-CM | POA: Insufficient documentation

## 2021-12-19 LAB — POCT URINALYSIS DIPSTICK OB
Bilirubin, UA: NEGATIVE
Blood, UA: NEGATIVE
Glucose, UA: NEGATIVE
Ketones, UA: NEGATIVE
Nitrite, UA: NEGATIVE
Spec Grav, UA: 1.015 (ref 1.010–1.025)
Urobilinogen, UA: 0.2 E.U./dL
pH, UA: 7.5 (ref 5.0–8.0)

## 2021-12-19 NOTE — Progress Notes (Signed)
ROB: Doing well, no complaints. Normal 28 week labs (except mild anemia noted).  Discussed plans for labor, would like to try natural labor. Discussed use of doula.  Patient notes sciatic pain bilaterally, advised on stretches. RTC in 2 weeks.

## 2021-12-27 ENCOUNTER — Encounter: Payer: Self-pay | Admitting: Obstetrics

## 2021-12-27 ENCOUNTER — Ambulatory Visit (INDEPENDENT_AMBULATORY_CARE_PROVIDER_SITE_OTHER): Payer: BC Managed Care – PPO | Admitting: Obstetrics

## 2021-12-27 VITALS — BP 92/60 | HR 101 | Wt 113.7 lb

## 2021-12-27 DIAGNOSIS — Z3A32 32 weeks gestation of pregnancy: Secondary | ICD-10-CM

## 2021-12-27 LAB — POCT URINALYSIS DIPSTICK OB
Bilirubin, UA: NEGATIVE
Blood, UA: NEGATIVE
Glucose, UA: NEGATIVE
Ketones, UA: NEGATIVE
Leukocytes, UA: NEGATIVE
Nitrite, UA: NEGATIVE
POC,PROTEIN,UA: NEGATIVE
Spec Grav, UA: 1.02 (ref 1.010–1.025)
Urobilinogen, UA: 0.2 E.U./dL
pH, UA: 6 (ref 5.0–8.0)

## 2021-12-27 NOTE — Progress Notes (Signed)
ROB at [redacted]w[redacted]d. Active baby. Abigail Johnston reports some BH ctx. She denies LOF and vaginal bleeding. Reviewed fetal kick counts, s/s of PTL, and when to go to the hospital. She is planning on trying for an unmedicated birth but is open to epidural if needed. Encouraged CBE. Anticipatory guidance about late 3rd trimester. RTC in 2 weeks.  Abigail Johnston Spanish, CNM

## 2022-01-09 ENCOUNTER — Encounter: Payer: Self-pay | Admitting: Obstetrics

## 2022-01-09 ENCOUNTER — Ambulatory Visit (INDEPENDENT_AMBULATORY_CARE_PROVIDER_SITE_OTHER): Payer: BC Managed Care – PPO | Admitting: Obstetrics

## 2022-01-09 VITALS — BP 93/58 | HR 92 | Wt 114.9 lb

## 2022-01-09 DIAGNOSIS — Z3A33 33 weeks gestation of pregnancy: Secondary | ICD-10-CM

## 2022-01-09 LAB — POCT URINALYSIS DIPSTICK OB
Bilirubin, UA: NEGATIVE
Blood, UA: NEGATIVE
Glucose, UA: NEGATIVE
Ketones, UA: NEGATIVE
Leukocytes, UA: NEGATIVE
Nitrite, UA: NEGATIVE
POC,PROTEIN,UA: NEGATIVE
Spec Grav, UA: 1.02 (ref 1.010–1.025)
Urobilinogen, UA: 0.2 E.U./dL
pH, UA: 6.5 (ref 5.0–8.0)

## 2022-01-09 NOTE — Progress Notes (Signed)
ROB at [redacted]w[redacted]d. Active baby. Abigail Johnston had a run of contractions last night but was able to sleep through them. Denies vaginal bleeding and LOF. Getting things ready at home. Has car seat. Plans [facility] for pediatrician. Discussed comfort measures for BH ctx, when to go to the hospital. Reviewed GBS and GC chlamydia swabs at next visit. RTC in 2 weeks.  Abigail Johnston Spanish, CNM

## 2022-01-23 ENCOUNTER — Other Ambulatory Visit (HOSPITAL_COMMUNITY)
Admission: RE | Admit: 2022-01-23 | Discharge: 2022-01-23 | Disposition: A | Discharge status: Home / Self Care | Payer: BC Managed Care – PPO | Source: Ambulatory Visit | Attending: Obstetrics and Gynecology | Admitting: Obstetrics and Gynecology

## 2022-01-23 ENCOUNTER — Ambulatory Visit (INDEPENDENT_AMBULATORY_CARE_PROVIDER_SITE_OTHER): Payer: BC Managed Care – PPO | Admitting: Obstetrics and Gynecology

## 2022-01-23 ENCOUNTER — Encounter: Payer: Self-pay | Admitting: Obstetrics and Gynecology

## 2022-01-23 ENCOUNTER — Encounter: Payer: BC Managed Care – PPO | Admitting: Certified Nurse Midwife

## 2022-01-23 VITALS — BP 90/59 | HR 82 | Wt 117.9 lb

## 2022-01-23 DIAGNOSIS — O09523 Supervision of elderly multigravida, third trimester: Secondary | ICD-10-CM | POA: Diagnosis present

## 2022-01-23 DIAGNOSIS — Z113 Encounter for screening for infections with a predominantly sexual mode of transmission: Secondary | ICD-10-CM

## 2022-01-23 DIAGNOSIS — Z3A35 35 weeks gestation of pregnancy: Secondary | ICD-10-CM | POA: Insufficient documentation

## 2022-01-23 DIAGNOSIS — Z3685 Encounter for antenatal screening for Streptococcus B: Secondary | ICD-10-CM

## 2022-01-23 LAB — POCT URINALYSIS DIPSTICK OB
Bilirubin, UA: NEGATIVE
Blood, UA: NEGATIVE
Glucose, UA: NEGATIVE
Ketones, UA: NEGATIVE
Leukocytes, UA: NEGATIVE
Nitrite, UA: NEGATIVE
POC,PROTEIN,UA: NEGATIVE
Spec Grav, UA: 1.025 (ref 1.010–1.025)
Urobilinogen, UA: 0.2 E.U./dL
pH, UA: 7.5 (ref 5.0–8.0)

## 2022-01-23 NOTE — Progress Notes (Signed)
ROB: No problems.  Has occasional irregular contractions but not strong like labor.  Cultures performed today GC/CT-GBS.

## 2022-01-23 NOTE — Progress Notes (Signed)
ROB. Patient states daily fetal movement with braxton hicks. GC/CT and GBS cultures ordered. Patient states no questions or concerns at this time.

## 2022-01-25 LAB — CERVICOVAGINAL ANCILLARY ONLY
Chlamydia: NEGATIVE
Comment: NEGATIVE
Comment: NORMAL
Neisseria Gonorrhea: NEGATIVE

## 2022-01-25 LAB — STREP GP B NAA: Strep Gp B NAA: NEGATIVE

## 2022-01-31 ENCOUNTER — Ambulatory Visit (INDEPENDENT_AMBULATORY_CARE_PROVIDER_SITE_OTHER): Payer: BC Managed Care – PPO | Admitting: Obstetrics and Gynecology

## 2022-01-31 ENCOUNTER — Encounter: Payer: Self-pay | Admitting: Obstetrics and Gynecology

## 2022-01-31 VITALS — BP 94/62 | HR 83 | Wt 119.9 lb

## 2022-01-31 DIAGNOSIS — Z3A37 37 weeks gestation of pregnancy: Secondary | ICD-10-CM

## 2022-01-31 DIAGNOSIS — O09523 Supervision of elderly multigravida, third trimester: Secondary | ICD-10-CM

## 2022-01-31 LAB — POCT URINALYSIS DIPSTICK OB
Bilirubin, UA: NEGATIVE
Blood, UA: NEGATIVE
Glucose, UA: NEGATIVE
Ketones, UA: NEGATIVE
Leukocytes, UA: NEGATIVE
Nitrite, UA: NEGATIVE
Spec Grav, UA: 1.015 (ref 1.010–1.025)
Urobilinogen, UA: 0.2 E.U./dL
pH, UA: 7 (ref 5.0–8.0)

## 2022-01-31 NOTE — Progress Notes (Signed)
ROB: Doing well.  Has occasional irregular contractions.  Feels daily fetal movement.

## 2022-01-31 NOTE — Progress Notes (Signed)
ROB. Patient states daily fetal movement and pressure.  She states no questions or concerns at this time.

## 2022-02-08 ENCOUNTER — Encounter: Payer: Self-pay | Admitting: Obstetrics and Gynecology

## 2022-02-08 ENCOUNTER — Ambulatory Visit (INDEPENDENT_AMBULATORY_CARE_PROVIDER_SITE_OTHER): Payer: BC Managed Care – PPO | Admitting: Obstetrics and Gynecology

## 2022-02-08 VITALS — BP 97/65 | HR 73 | Wt 119.8 lb

## 2022-02-08 DIAGNOSIS — O09523 Supervision of elderly multigravida, third trimester: Secondary | ICD-10-CM

## 2022-02-08 DIAGNOSIS — Z3A38 38 weeks gestation of pregnancy: Secondary | ICD-10-CM

## 2022-02-08 LAB — POCT URINALYSIS DIPSTICK OB
Bilirubin, UA: NEGATIVE
Blood, UA: NEGATIVE
Glucose, UA: NEGATIVE
Ketones, UA: NEGATIVE
Leukocytes, UA: NEGATIVE
Nitrite, UA: NEGATIVE
POC,PROTEIN,UA: NEGATIVE
Spec Grav, UA: 1.015 (ref 1.010–1.025)
Urobilinogen, UA: 0.2 E.U./dL
pH, UA: 7 (ref 5.0–8.0)

## 2022-02-08 NOTE — Progress Notes (Signed)
ROB. Patient states daily fetal movement with pressure and braxton hicks. Patient states no questions or concerns at this time.

## 2022-02-08 NOTE — Progress Notes (Signed)
ROB: Denies problems.  Has Braxton Hicks contractions but no hard contractions.  Signs and symptoms of labor discussed.  Cervix very favorable.

## 2022-02-14 ENCOUNTER — Encounter: Payer: Self-pay | Admitting: Obstetrics and Gynecology

## 2022-02-14 ENCOUNTER — Ambulatory Visit (INDEPENDENT_AMBULATORY_CARE_PROVIDER_SITE_OTHER): Payer: BC Managed Care – PPO | Admitting: Obstetrics and Gynecology

## 2022-02-14 VITALS — BP 100/60 | HR 75 | Wt 121.9 lb

## 2022-02-14 DIAGNOSIS — Z3A39 39 weeks gestation of pregnancy: Secondary | ICD-10-CM

## 2022-02-14 DIAGNOSIS — O09523 Supervision of elderly multigravida, third trimester: Secondary | ICD-10-CM

## 2022-02-14 NOTE — Progress Notes (Signed)
ROB: doing well, no complaints. Reviewed labor precautions. RTC in 1 week, and for NST for postdates. Declined flu vaccine.

## 2022-02-15 ENCOUNTER — Encounter: Payer: BC Managed Care – PPO | Admitting: Obstetrics and Gynecology

## 2022-02-15 DIAGNOSIS — O09523 Supervision of elderly multigravida, third trimester: Secondary | ICD-10-CM

## 2022-02-15 DIAGNOSIS — Z3A39 39 weeks gestation of pregnancy: Secondary | ICD-10-CM

## 2022-02-21 ENCOUNTER — Other Ambulatory Visit: Payer: Self-pay

## 2022-02-21 ENCOUNTER — Inpatient Hospital Stay
Admission: EM | Admit: 2022-02-21 | Discharge: 2022-02-22 | DRG: 798 | Disposition: A | Discharge status: Home / Self Care | Payer: BC Managed Care – PPO | Attending: Obstetrics | Admitting: Obstetrics

## 2022-02-21 ENCOUNTER — Other Ambulatory Visit: Payer: BC Managed Care – PPO

## 2022-02-21 ENCOUNTER — Inpatient Hospital Stay: Payer: BC Managed Care – PPO | Admitting: Anesthesiology

## 2022-02-21 ENCOUNTER — Encounter
Admission: EM | Disposition: A | Discharge status: Home / Self Care | Payer: Self-pay | Source: Home / Self Care | Attending: Obstetrics

## 2022-02-21 ENCOUNTER — Ambulatory Visit: Payer: BC Managed Care – PPO | Admitting: Obstetrics and Gynecology

## 2022-02-21 ENCOUNTER — Encounter: Payer: Self-pay | Admitting: Obstetrics

## 2022-02-21 DIAGNOSIS — Z3A40 40 weeks gestation of pregnancy: Secondary | ICD-10-CM

## 2022-02-21 DIAGNOSIS — O4202 Full-term premature rupture of membranes, onset of labor within 24 hours of rupture: Secondary | ICD-10-CM | POA: Diagnosis not present

## 2022-02-21 DIAGNOSIS — O26893 Other specified pregnancy related conditions, third trimester: Secondary | ICD-10-CM | POA: Diagnosis present

## 2022-02-21 DIAGNOSIS — Z302 Encounter for sterilization: Secondary | ICD-10-CM

## 2022-02-21 DIAGNOSIS — O480 Post-term pregnancy: Secondary | ICD-10-CM | POA: Diagnosis not present

## 2022-02-21 HISTORY — PX: TUBAL LIGATION: SHX77

## 2022-02-21 LAB — CBC WITH DIFFERENTIAL/PLATELET
Abs Immature Granulocytes: 0.02 10*3/uL (ref 0.00–0.07)
Basophils Absolute: 0 10*3/uL (ref 0.0–0.1)
Basophils Relative: 0 %
Eosinophils Absolute: 0.1 10*3/uL (ref 0.0–0.5)
Eosinophils Relative: 1 %
HCT: 39.5 % (ref 36.0–46.0)
Hemoglobin: 12.8 g/dL (ref 12.0–15.0)
Immature Granulocytes: 0 %
Lymphocytes Relative: 33 %
Lymphs Abs: 2.3 10*3/uL (ref 0.7–4.0)
MCH: 26.9 pg (ref 26.0–34.0)
MCHC: 32.4 g/dL (ref 30.0–36.0)
MCV: 83.2 fL (ref 80.0–100.0)
Monocytes Absolute: 0.7 10*3/uL (ref 0.1–1.0)
Monocytes Relative: 10 %
Neutro Abs: 3.7 10*3/uL (ref 1.7–7.7)
Neutrophils Relative %: 56 %
Platelets: 195 10*3/uL (ref 150–400)
RBC: 4.75 MIL/uL (ref 3.87–5.11)
RDW: 15.7 % — ABNORMAL HIGH (ref 11.5–15.5)
WBC: 6.7 10*3/uL (ref 4.0–10.5)
nRBC: 0 % (ref 0.0–0.2)

## 2022-02-21 LAB — TYPE AND SCREEN
ABO/RH(D): B POS
Antibody Screen: NEGATIVE

## 2022-02-21 SURGERY — LIGATION, FALLOPIAN TUBE, POSTPARTUM
Anesthesia: Spinal | Site: Abdomen | Laterality: Right

## 2022-02-21 MED ORDER — FENTANYL CITRATE (PF) 100 MCG/2ML IJ SOLN: 50.0000 ug | INTRAMUSCULAR | Status: DC | PRN

## 2022-02-21 MED ORDER — OXYTOCIN BOLUS FROM INFUSION
333.0000 mL | Freq: Once | INTRAVENOUS | Status: AC
  Administered 2022-02-21: 333 mL via INTRAVENOUS

## 2022-02-21 MED ORDER — LIDOCAINE HCL (PF) 1 % IJ SOLN
INTRAMUSCULAR | Status: DC | PRN
  Administered 2022-02-21: 3 mL via SUBCUTANEOUS

## 2022-02-21 MED ORDER — BUPIVACAINE HCL (PF) 0.5 % IJ SOLN
INTRAMUSCULAR | Status: AC
  Filled 2022-02-21: qty 30

## 2022-02-21 MED ORDER — DIPHENHYDRAMINE HCL 50 MG/ML IJ SOLN: 12.5000 mg | INTRAMUSCULAR | Status: DC | PRN

## 2022-02-21 MED ORDER — LACTATED RINGERS IV SOLN: INTRAVENOUS | Status: DC

## 2022-02-21 MED ORDER — MISOPROSTOL 200 MCG PO TABS
ORAL_TABLET | ORAL | Status: AC
  Filled 2022-02-21: qty 4

## 2022-02-21 MED ORDER — OXYTOCIN 10 UNIT/ML IJ SOLN
INTRAMUSCULAR | Status: AC
  Filled 2022-02-21: qty 2

## 2022-02-21 MED ORDER — BUPIVACAINE HCL 0.5 % IJ SOLN
INTRAMUSCULAR | Status: DC | PRN
  Administered 2022-02-21: 10 mL

## 2022-02-21 MED ORDER — ACETAMINOPHEN 325 MG PO TABS: 650.0000 mg | ORAL_TABLET | ORAL | Status: DC | PRN

## 2022-02-21 MED ORDER — LIDOCAINE-EPINEPHRINE (PF) 1.5 %-1:200000 IJ SOLN
INTRAMUSCULAR | Status: DC | PRN
  Administered 2022-02-21: 3 mL via EPIDURAL

## 2022-02-21 MED ORDER — EPHEDRINE 5 MG/ML INJ: 10.0000 mg | INTRAVENOUS | Status: DC | PRN

## 2022-02-21 MED ORDER — WITCH HAZEL-GLYCERIN EX PADS
1.0000 | MEDICATED_PAD | CUTANEOUS | Status: DC | PRN
  Administered 2022-02-21: 1 via TOPICAL
  Filled 2022-02-21: qty 100

## 2022-02-21 MED ORDER — BENZOCAINE-MENTHOL 20-0.5 % EX AERO
1.0000 | INHALATION_SPRAY | CUTANEOUS | Status: DC | PRN
  Administered 2022-02-21: 1 via TOPICAL
  Filled 2022-02-21: qty 56

## 2022-02-21 MED ORDER — PRENATAL MULTIVITAMIN CH
1.0000 | ORAL_TABLET | Freq: Every day | ORAL | Status: DC
  Administered 2022-02-22: 1 via ORAL
  Filled 2022-02-21: qty 1

## 2022-02-21 MED ORDER — SOD CITRATE-CITRIC ACID 500-334 MG/5ML PO SOLN: 30.0000 mL | ORAL | Status: DC | PRN

## 2022-02-21 MED ORDER — METOCLOPRAMIDE HCL 10 MG PO TABS
10.0000 mg | ORAL_TABLET | Freq: Once | ORAL | Status: DC
  Filled 2022-02-21: qty 1

## 2022-02-21 MED ORDER — OXYCODONE HCL 5 MG PO TABS: 5.0000 mg | ORAL_TABLET | Freq: Four times a day (QID) | ORAL | Status: DC | PRN

## 2022-02-21 MED ORDER — ONDANSETRON HCL 4 MG/2ML IJ SOLN: 4.0000 mg | Freq: Four times a day (QID) | INTRAMUSCULAR | Status: DC | PRN

## 2022-02-21 MED ORDER — DIPHENHYDRAMINE HCL 25 MG PO CAPS: 25.0000 mg | ORAL_CAPSULE | Freq: Four times a day (QID) | ORAL | Status: DC | PRN

## 2022-02-21 MED ORDER — ONDANSETRON HCL 4 MG PO TABS: 4.0000 mg | ORAL_TABLET | ORAL | Status: DC | PRN

## 2022-02-21 MED ORDER — AMMONIA AROMATIC IN INHA
RESPIRATORY_TRACT | Status: AC
  Filled 2022-02-21: qty 10

## 2022-02-21 MED ORDER — ONDANSETRON HCL 4 MG/2ML IJ SOLN: 4.0000 mg | INTRAMUSCULAR | Status: DC | PRN

## 2022-02-21 MED ORDER — SIMETHICONE 80 MG PO CHEW: 80.0000 mg | CHEWABLE_TABLET | ORAL | Status: DC | PRN

## 2022-02-21 MED ORDER — PHENYLEPHRINE 80 MCG/ML (10ML) SYRINGE FOR IV PUSH (FOR BLOOD PRESSURE SUPPORT): 80.0000 ug | PREFILLED_SYRINGE | INTRAVENOUS | Status: DC | PRN

## 2022-02-21 MED ORDER — OXYTOCIN-SODIUM CHLORIDE 30-0.9 UT/500ML-% IV SOLN
INTRAVENOUS | Status: AC
  Filled 2022-02-21: qty 500

## 2022-02-21 MED ORDER — DIBUCAINE (PERIANAL) 1 % EX OINT
1.0000 | TOPICAL_OINTMENT | CUTANEOUS | Status: DC | PRN
  Administered 2022-02-21: 1 via RECTAL
  Filled 2022-02-21: qty 28

## 2022-02-21 MED ORDER — FAMOTIDINE 20 MG PO TABS: 40.0000 mg | ORAL_TABLET | Freq: Once | ORAL | Status: DC

## 2022-02-21 MED ORDER — LACTATED RINGERS IV SOLN
500.0000 mL | Freq: Once | INTRAVENOUS | Status: AC
  Administered 2022-02-21: 500 mL via INTRAVENOUS

## 2022-02-21 MED ORDER — LIDOCAINE HCL (PF) 1 % IJ SOLN
INTRAMUSCULAR | Status: AC
  Filled 2022-02-21: qty 30

## 2022-02-21 MED ORDER — SODIUM CHLORIDE 0.9 % IV SOLN
INTRAVENOUS | Status: DC | PRN
  Administered 2022-02-21: 7 mL via EPIDURAL

## 2022-02-21 MED ORDER — COCONUT OIL OIL: 1.0000 | TOPICAL_OIL | Status: DC | PRN

## 2022-02-21 MED ORDER — ACETAMINOPHEN 325 MG PO TABS
650.0000 mg | ORAL_TABLET | ORAL | Status: DC | PRN
  Administered 2022-02-21 – 2022-02-22 (×4): 650 mg via ORAL
  Filled 2022-02-21 (×4): qty 2

## 2022-02-21 MED ORDER — BUPIVACAINE IN DEXTROSE 0.75-8.25 % IT SOLN
INTRATHECAL | Status: DC | PRN
  Administered 2022-02-21: 2 mL via INTRATHECAL

## 2022-02-21 MED ORDER — IBUPROFEN 600 MG PO TABS
600.0000 mg | ORAL_TABLET | Freq: Four times a day (QID) | ORAL | Status: DC
  Administered 2022-02-21 – 2022-02-22 (×4): 600 mg via ORAL
  Filled 2022-02-21 (×4): qty 1

## 2022-02-21 MED ORDER — FENTANYL-BUPIVACAINE-NACL 0.5-0.125-0.9 MG/250ML-% EP SOLN
10.0000 mL/h | EPIDURAL | Status: DC | PRN
  Administered 2022-02-21: 10 mL/h via EPIDURAL
  Filled 2022-02-21: qty 250

## 2022-02-21 MED ORDER — OXYTOCIN-SODIUM CHLORIDE 30-0.9 UT/500ML-% IV SOLN: 2.5000 [IU]/h | INTRAVENOUS | Status: DC

## 2022-02-21 MED ORDER — HYDROXYZINE HCL 25 MG PO TABS: 50.0000 mg | ORAL_TABLET | Freq: Four times a day (QID) | ORAL | Status: DC | PRN

## 2022-02-21 MED ORDER — SENNOSIDES-DOCUSATE SODIUM 8.6-50 MG PO TABS
2.0000 | ORAL_TABLET | Freq: Every day | ORAL | Status: DC
  Administered 2022-02-22: 2 via ORAL
  Filled 2022-02-21: qty 2

## 2022-02-21 MED ORDER — LIDOCAINE HCL (PF) 1 % IJ SOLN: 30.0000 mL | INTRAMUSCULAR | Status: DC | PRN

## 2022-02-21 MED ORDER — MIDAZOLAM HCL 5 MG/5ML IJ SOLN
INTRAMUSCULAR | Status: DC | PRN
  Administered 2022-02-21: 2 mg via INTRAVENOUS

## 2022-02-21 MED ORDER — MIDAZOLAM HCL 2 MG/2ML IJ SOLN
INTRAMUSCULAR | Status: AC
  Filled 2022-02-21: qty 2

## 2022-02-21 MED ORDER — LACTATED RINGERS IV SOLN
500.0000 mL | INTRAVENOUS | Status: DC | PRN
  Administered 2022-02-21: 500 mL via INTRAVENOUS

## 2022-02-21 SURGICAL SUPPLY — 23 items
BLADE SURG SZ11 CARB STEEL (BLADE) ×1 IMPLANT
CHLORAPREP W/TINT 26 (MISCELLANEOUS) ×1 IMPLANT
DERMABOND ADVANCED .7 DNX12 (GAUZE/BANDAGES/DRESSINGS) ×1 IMPLANT
DRAPE LAPAROTOMY 100X77 ABD (DRAPES) ×1 IMPLANT
GAUZE 4X4 16PLY ~~LOC~~+RFID DBL (SPONGE) ×1 IMPLANT
GLOVE BIO SURGEON STRL SZ 6.5 (GLOVE) ×1 IMPLANT
GLOVE SURG UNDER LTX SZ7 (GLOVE) ×1 IMPLANT
GOWN STRL REUS W/ TWL LRG LVL3 (GOWN DISPOSABLE) ×2 IMPLANT
GOWN STRL REUS W/TWL LRG LVL3 (GOWN DISPOSABLE) ×2
KIT TURNOVER CYSTO (KITS) ×1 IMPLANT
MANIFOLD NEPTUNE II (INSTRUMENTS) ×1 IMPLANT
NDL HYPO 25GX1X1/2 BEV (NEEDLE) ×1 IMPLANT
NEEDLE HYPO 25GX1X1/2 BEV (NEEDLE) ×1 IMPLANT
NS IRRIG 500ML POUR BTL (IV SOLUTION) ×1 IMPLANT
PACK BASIN MINOR ARMC (MISCELLANEOUS) ×1 IMPLANT
RETRACTOR RING XSMALL (MISCELLANEOUS) IMPLANT
RTRCTR WOUND ALEXIS 13CM XS SH (MISCELLANEOUS) ×1
SUT MNCRL AB 4-0 PS2 18 (SUTURE) IMPLANT
SUT PLAIN GUT 0 (SUTURE) ×2 IMPLANT
SUT VIC AB 3-0 SH 27 (SUTURE) ×1
SUT VIC AB 3-0 SH 27X BRD (SUTURE) ×1 IMPLANT
SUT VICRYL 0 AB UR-6 (SUTURE) ×2 IMPLANT
SYR 10ML LL (SYRINGE) ×1 IMPLANT

## 2022-02-21 NOTE — Anesthesia Preprocedure Evaluation (Signed)
Anesthesia Evaluation  Patient identified by MRN, date of birth, ID band Patient awake    Reviewed: Allergy & Precautions, NPO status , Patient's Chart, lab work & pertinent test results  History of Anesthesia Complications Negative for: history of anesthetic complications  Airway Mallampati: III  TM Distance: >3 FB Neck ROM: Full    Dental no notable dental hx. (+) Teeth Intact   Pulmonary neg pulmonary ROS, neg sleep apnea, neg COPD, Patient abstained from smoking.Not current smoker,    Pulmonary exam normal breath sounds clear to auscultation       Cardiovascular Exercise Tolerance: Good METS(-) hypertension(-) CAD and (-) Past MI negative cardio ROS  (-) dysrhythmias  Rhythm:Regular Rate:Normal - Systolic murmurs    Neuro/Psych  Headaches, negative psych ROS   GI/Hepatic neg GERD  ,(+)     (-) substance abuse  ,   Endo/Other  neg diabetes  Renal/GU negative Renal ROS     Musculoskeletal   Abdominal   Peds  Hematology   Anesthesia Other Findings Past Medical History: No date: Headache No date: Migraine headache with aura  Reproductive/Obstetrics (+) Pregnancy                             Anesthesia Physical Anesthesia Plan  ASA: 2  Anesthesia Plan: Epidural   Post-op Pain Management:    Induction:   PONV Risk Score and Plan: 2 and Treatment may vary due to age or medical condition and Ondansetron  Airway Management Planned: Natural Airway  Additional Equipment:   Intra-op Plan:   Post-operative Plan:   Informed Consent: I have reviewed the patients History and Physical, chart, labs and discussed the procedure including the risks, benefits and alternatives for the proposed anesthesia with the patient or authorized representative who has indicated his/her understanding and acceptance.       Plan Discussed with: Surgeon  Anesthesia Plan Comments: (Discussed R/B/A  of neuraxial anesthesia technique with patient: - rare risks of spinal/epidural hematoma, nerve damage, infection - Risk of PDPH - Risk of itching - Risk of nausea and vomiting - Risk of poor block necessitating replacement of epidural. - Risk of allergic reactions. Patient voiced understanding.)        Anesthesia Quick Evaluation

## 2022-02-21 NOTE — Anesthesia Procedure Notes (Addendum)
Spinal  Patient location during procedure: OR Start time: 02/21/2022 1:30 PM Reason for block: surgical anesthesia Staffing Performed: other anesthesia staff  Anesthesiologist: Martha Clan, MD Resident/CRNA: Rolla Plate, CRNA Performed by: Rolla Plate, CRNA Authorized by: Martha Clan, MD   Spinal Block Patient position: sitting Prep: ChloraPrep Patient monitoring: heart rate, continuous pulse ox and blood pressure Approach: midline Location: L4-5 Injection technique: single-shot Needle Needle type: Introducer and Pencan  Needle gauge: 24 G Needle length: 9 cm Assessment Sensory level: T6 Events: CSF return Additional Notes Negative paresthesia. Negative blood return. Positive free-flowing CSF. Expiration date of kit checked and confirmed. Patient tolerated procedure well, without complications. Spinal performed by Donnie Mesa RN

## 2022-02-21 NOTE — Discharge Instructions (Signed)

## 2022-02-21 NOTE — Discharge Summary (Signed)
OB Discharge Summary     Patient Name: Abigail Johnston DOB: 12/18/1985 MRN: [id number]  Date of admission: 02/21/2022 Delivering provider: Rod Can, CNM  Date of Delivery: 02/21/2022  Date of discharge: 02/22/2022  Admitting diagnosis: Labor and delivery, indication for care [O75.9] Intrauterine pregnancy: [redacted]w[redacted]d     Secondary diagnosis: None     Discharge diagnosis: Term Pregnancy Delivered                                                                                                Post partum procedures:postpartum tubal ligation  Augmentation: N/A  Complications: None  Hospital course:  Onset of Labor With Vaginal Delivery      35 y.o. yo G3P1011 at [redacted]w[redacted]d was admitted in Active Labor on 02/21/2022.  Labor course was complicated by Category II fetal tracing in second stage  Membrane Rupture Time/Date: 3:10 AM ,02/21/2022   Delivery Method:Vaginal, Spontaneous  Episiotomy: None  Lacerations:  1st degree  See delivery note for details  Patient had a postpartum course with a tubal post delivery. She is tolerating regular diet, her pain is controlled with PO medication, she is ambulating and voiding without difficulty.   Patient is discharged home in stable condition on 02/22/22.  Newborn Data: Birth date:02/21/2022  Birth time:10:16 AM  Gender:Female  [place] Living status:Living living Apgars:4 ,8  Weight:3030 g   Physical exam  Vitals:   02/21/22 2018 02/21/22 2307 02/22/22 0327 02/22/22 0757  BP: 101/63 (!) 94/59 104/66 105/63  Pulse: 66 79 71 68  Resp: 20 18 20 18   Temp: 98.2 F (36.8 C) 98.2 F (36.8 C) 98.4 F (36.9 C) 98.2 F (36.8 C)  TempSrc: Oral Oral Oral   SpO2: 98% 99% 98% 99%  Weight:      Height:       General: alert, cooperative, and no distress Lochia: appropriate Uterine Fundus: firm Incision: Healing well with no significant drainage, No significant erythema DVT Evaluation: No evidence of DVT seen on physical exam. Negative  Homan's sign. No cords or calf tenderness.  Labs: Lab Results  Component Value Date   WBC 9.6 02/22/2022   HGB 10.2 (L) 02/22/2022   HCT 31.0 (L) 02/22/2022   MCV 83.8 02/22/2022   PLT 152 02/22/2022    Discharge instruction: per After Visit Summary.  Medications:  Allergies as of 02/22/2022   No Known Allergies      Medication List     TAKE these medications    ibuprofen 600 MG tablet Commonly known as: ADVIL Take 1 tablet (600 mg total) by mouth every 6 (six) hours.   multivitamin-prenatal 27-0.8 MG Tabs tablet Take 1 tablet by mouth daily at 12 noon.        Diet: routine diet  Activity: Advance as tolerated. Pelvic rest for 6 weeks.   Outpatient follow up:  Follow-up Information     Rod Can, CNM. Schedule an appointment as soon as possible for a visit in 2 week(s).   Specialty: Obstetrics Why: 2 week telephone postpartum visit and in-office visit at 6 weeks Contact information: [address] [place] [address]  [phone]                   Postpartum contraception: Tubal Ligation Rhogam Given postpartum: Rh positive Rubella vaccine given postpartum: immune Varicella vaccine given postpartum: immune TDaP given antepartum or postpartum: given antepartum    Newborn Delivery   Birth date/time: 02/21/2022 10:16:00 Delivery type: Vaginal, Spontaneous       Baby Feeding: Breast  Disposition:home with mother  SIGNED:  Mirna Mires, CNM 02/22/2022 10:08 AM

## 2022-02-21 NOTE — Anesthesia Preprocedure Evaluation (Signed)
Anesthesia Evaluation  Patient identified by MRN, date of birth, ID band Patient awake    Reviewed: Allergy & Precautions, NPO status , Patient's Chart, lab work & pertinent test results  History of Anesthesia Complications Negative for: history of anesthetic complications  Airway Mallampati: III  TM Distance: >3 FB Neck ROM: Full    Dental no notable dental hx. (+) Teeth Intact   Pulmonary neg pulmonary ROS, neg sleep apnea, neg COPD, Patient abstained from smoking.Not current smoker,    Pulmonary exam normal breath sounds clear to auscultation       Cardiovascular Exercise Tolerance: Good METS(-) hypertension(-) CAD and (-) Past MI negative cardio ROS  (-) dysrhythmias  Rhythm:Regular Rate:Normal - Systolic murmurs    Neuro/Psych  Headaches, negative psych ROS   GI/Hepatic neg GERD  ,(+)     (-) substance abuse  ,   Endo/Other  neg diabetes  Renal/GU negative Renal ROS     Musculoskeletal   Abdominal   Peds  Hematology   Anesthesia Other Findings Past Medical History: No date: Headache No date: Migraine headache with aura  Reproductive/Obstetrics (+) Breast feeding                              Anesthesia Physical  Anesthesia Plan  ASA: 2  Anesthesia Plan: Spinal   Post-op Pain Management:    Induction:   PONV Risk Score and Plan: 2 and Treatment may vary due to age or medical condition, Ondansetron and Midazolam  Airway Management Planned: Natural Airway  Additional Equipment:   Intra-op Plan:   Post-operative Plan:   Informed Consent: I have reviewed the patients History and Physical, chart, labs and discussed the procedure including the risks, benefits and alternatives for the proposed anesthesia with the patient or authorized representative who has indicated his/her understanding and acceptance.       Plan Discussed with: Surgeon  Anesthesia Plan Comments:  (Discussed R/B/A of neuraxial anesthesia technique with patient: - rare risks of spinal/epidural hematoma, nerve damage, infection - Risk of PDPH - Risk of itching - Risk of nausea and vomiting - Risk of poor block necessitating replacement of epidural. - Risk of allergic reactions. Patient voiced understanding.)        Anesthesia Quick Evaluation

## 2022-02-21 NOTE — Anesthesia Procedure Notes (Signed)
Epidural Patient location during procedure: OB  Staffing Anesthesiologist: Arita Miss, MD Performed: anesthesiologist   Preanesthetic Checklist Completed: patient identified, IV checked, site marked, risks and benefits discussed, surgical consent, monitors and equipment checked, pre-op evaluation and timeout performed  Epidural Patient position: sitting Prep: ChloraPrep Patient monitoring: heart rate, continuous pulse ox and blood pressure Approach: midline Location: L4-L5 Injection technique: LOR saline  Needle:  Needle type: Tuohy  Needle gauge: 17 G Needle length: 9 cm Needle insertion depth: 4 cm Catheter type: closed end flexible Catheter size: 19 Gauge Catheter at skin depth: 9 cm Test dose: negative and 1.5% lidocaine with Epi 1:200 K  Assessment Sensory level: T10 Events: blood not aspirated, injection not painful, no injection resistance, no paresthesia and negative IV test  Additional Notes First/one attempt Pt. Evaluated and documentation done after procedure finished. Patient identified. Risks/Benefits/Options discussed with patient including but not limited to bleeding, infection, nerve damage, paralysis, failed block, incomplete pain control, headache, blood pressure changes, nausea, vomiting, reactions to medication both or allergic, itching and postpartum back pain. Confirmed with bedside nurse the patient's most recent platelet count. Confirmed with patient that they are not currently taking any anticoagulation, have any bleeding history or any family history of bleeding disorders. Patient expressed understanding and wished to proceed. All questions were answered. Sterile technique was used throughout the entire procedure. Please see nursing notes for vital signs. Test dose was given through epidural catheter and negative prior to continuing to dose epidural or start infusion. Warning signs of high block given to the patient including shortness of breath,  tingling/numbness in hands, complete motor block, or any concerning symptoms with instructions to call for help. Patient was given instructions on fall risk and not to get out of bed. All questions and concerns addressed with instructions to call with any issues or inadequate analgesia.     Patient tolerated the insertion well without immediate complications.  Reason for block: procedure for painReason for block:procedure for pain

## 2022-02-21 NOTE — Lactation Note (Signed)
This note was copied from a baby's chart. Lactation Consultation Note  Patient Name: Abigail Johnston WCHEN'I Date: 02/21/2022 Reason for consult: Initial assessment;Term Age:37 hours  Maternal Data Has patient been taught Hand Expression?: Yes Does the patient have breastfeeding experience prior to this delivery?: Yes How long did the patient breastfeed?: 2 months  P2, SVD 6 hours ago. Mom underwent BTL following delivery. Baby fed 2x for total of 42 minutes before mom left for surgery and did not receive supplement during surgery.  Mom mainly pumped and bottle fed with first child (now yrs old), and desires breastfeeding with this baby.  Feeding Mother's Current Feeding Choice: Breast Milk  LATCH Score Latch: Repeated attempts needed to sustain latch, nipple held in mouth throughout feeding, stimulation needed to elicit sucking reflex.  Audible Swallowing: None  Type of Nipple: Everted at rest and after stimulation  Comfort (Breast/Nipple): Soft / non-tender  Hold (Positioning): Assistance needed to correctly position infant at breast and maintain latch.  LATCH Score: 6  Baby skin to skin with mom for first time since returning from surgery; attempting to feed for first time since returning from surgery. Baby awake/alert, calm. LC hand expressed colostrum easily, baby interested in licking the colostrum but not active in latching or attempting to feed.   Lactation Tools Discussed/Used    Interventions Interventions: Breast feeding basics reviewed;Assisted with latch;Hand express;Support pillows;Adjust position;Position options;Education (positioinal changes for breast acceptance, position/alignment of baby at the breast, deep vs shallow latch, signs of transfer, output expectations, hand expression and use of hand expression often if baby is sleepy)  Encouraged ongoing skin to skin, frequent feeding attempts and frequent hand expression if baby is sleepy.  Discussed  typical feeding patterns in first 24 hours, and potential for cluster feeding overnight tonight.  Discharge    Consult Status Consult Status: Follow-up    Lavonia Drafts 02/21/2022, 5:04 PM

## 2022-02-21 NOTE — H&P (Signed)
History and Physical   HPI  Abigail Johnston is a 36 y.o. G3P1011 at [redacted]w[redacted]d Estimated Date of Delivery: 02/21/22 who is being admitted for labor. She reports SROM for clear fluid at 0310. Contractions started at 0326 and quickly became regular. She denies vaginal bleeding and endorses good fetal movement.   OB History  OB History  Gravida Para Term Preterm AB Living  3 1 1  0 1 1  SAB IAB Ectopic Multiple Live Births  0 0 0 0 1    # Outcome Date GA Lbr Len/2nd Weight Sex Delivery Anes PTL Lv  3 Current           2 Term 10/04/19 [redacted]w[redacted]d  2320 g F Vag-Vacuum EPI  LIV     Name: Abigail Johnston     Apgar1: 8  Apgar5: 9  1 AB 2012            PROBLEM LIST  Pregnancy complications or risks: Patient Active Problem List   Diagnosis Date Noted   Labor and delivery, indication for care 02/21/2022   Maternal hypotension syndrome in third trimester 12/19/2021   Supervision of elderly multigravida (>=75 years old at time of delivery), third trimester 08/30/2021   Pregnancy 10/04/2019   Vitamin D deficiency 06/08/2016   Idiopathic hypotension 06/08/2016    Prenatal labs and studies: ABO, Rh: B/Positive/-- (04/04 1233) Antibody: Negative (04/04 1233) Rubella: 4.42 (04/04 1233) RPR: Non Reactive (07/25 0831)  HBsAg: Negative (04/04 1233)  HIV: Non Reactive (04/04 1233)  VQQ:VZDGLOVF/-- (09/19 1546)   Past Medical History:  Diagnosis Date   Headache    Migraine headache with aura      Past Surgical History:  Procedure Laterality Date   LAPAROSCOPY Left 01/19/2021   Procedure: LAPAROSCOPY DIAGNOSTIC LEFT SALPINGECTOMY WITH REMOVAL OF ECTOPIC;  Surgeon: Rubie Maid, MD;  Location: [facility] ORS;  Service: Gynecology;  Laterality: Left;   WISDOM TOOTH EXTRACTION       Medications    Current Discharge Medication List     CONTINUE these medications which have NOT CHANGED   Details  Prenatal Vit-Fe Fumarate-FA (MULTIVITAMIN-PRENATAL) 27-0.8 MG TABS tablet Take 1 tablet by  mouth daily at 12 noon.         Allergies  Patient has no known allergies.  Review of Systems  ROS negative except as noted in HPI  Physical Exam  Ht 4\' 11"  (1.499 m)   Wt 55 kg   LMP 05/17/2021   BMI 24.49 kg/m   Lungs:  CTA B Cardio: RRR without M/R/G Abd: Soft, gravid, NT Presentation: cephalic  CERVIX: Dilation: 6 Effacement (%): 100 Station: -1 Presentation: Vertex Exam by:: JDaley  See Prenatal records for more detailed PE.     FHR:  Baseline: 130 Variability: minimal Accelerations: present Decelerations: absent Toco: regular, every 5 minutes Category 2  Test Results  No results found for this or any previous visit (from the past 24 hour(s)). Group B Strep negative  Assessment   G3P1011 at [redacted]w[redacted]d Estimated Date of Delivery: 02/21/22  Reassuring maternal/fetal status. SROM at term Active labor  Patient Active Problem List   Diagnosis Date Noted   Labor and delivery, indication for care 02/21/2022   Maternal hypotension syndrome in third trimester 12/19/2021   Supervision of elderly multigravida (>=64 years old at time of delivery), third trimester 08/30/2021   Pregnancy 10/04/2019   Vitamin D deficiency 06/08/2016   Idiopathic hypotension 06/08/2016    Plan  1. Admit to L&D: active labor  2. EFM: continuous -- Category 2 3. Pharmacologic pain relief if desired.   4. Admission labs  5. Anticipate NSVD 6. Dr. Valentino Saxon notified of admission  [name], [facility] 02/21/2022 4:13 AM

## 2022-02-21 NOTE — Progress Notes (Signed)
LABOR NOTE   SUBJECTIVE:   Abigail Johnston is a 36 y.o.  G3P1011  at [redacted]w[redacted]d who is laboring spontaneously. She is comfortable with her epidural. She is making cervical change. She has a moderate amount of bloody show. She is not feeling any rectal pressure.   Analgesia: Epidural  OBJECTIVE:  BP (!) 94/58   Pulse 71   Temp 98 F (36.7 C) (Oral)   Resp 16   Ht 4\' 11"  (1.499 m)   Wt 55 kg   LMP 05/17/2021   SpO2 100%   BMI 24.49 kg/m  No intake/output data recorded.  SVE:   Dilation: Lip/rim Effacement (%): 100 Station: -1 Exam by:: Gary Gabrielsen CNM CONTRACTIONS: regular, every 4-5 minutes FHR: Fetal heart tracing reviewed. Baseline: 115 Variability: moderate Accelerations: present Decelerations:early Category 1  Labs: Lab Results  Component Value Date   WBC 6.7 02/21/2022   HGB 12.8 02/21/2022   HCT 39.5 02/21/2022   MCV 83.2 02/21/2022   PLT 195 02/21/2022    ASSESSMENT: 1) Spontaneous labor, progressing normally     Coping: well     Membranes: ruptured, bloody fluid   Principal Problem:   Labor and delivery, indication for care   PLAN: Expectant management Anticipate NSVD   Lloyd Huger, CNM 02/21/2022 7:42 AM

## 2022-02-21 NOTE — Op Note (Signed)
Procedure(s): POST PARTUM RIGHT SALPINGECTOMY Procedure Note  SHARAN MCENANEY female 36 y.o. 02/21/2022  Indications: The patient is a 36 y.o. G68P2012 female with undesired fertility, postpartum Day #0. Prior history of left salpingectomy for ectopic pregnancy.   Pre-operative Diagnosis: Undesired fertility, history of left salpingectomy.   Post-operative Diagnosis: Same  Surgeon: Rubie Maid, MD  Assistants:  Surgical scrub assist.   Anesthesia: Spinal   Findings: The uterus was palpable at umbilicus.  Normal appearing right fallopian tube and ovary.   Procedure Details: The patient was seen in the Holding Room. The risks, benefits, complications, treatment options, and expected outcomes were discussed with the patient.  The patient concurred with the proposed plan, giving informed consent.  The site of surgery properly noted/marked. The patient was taken to the Operating Room, identified as Abigail Johnston and the procedure verified as Procedure(s) (LRB): POST PARTUM TUBAL LIGATION (Right). A Time Out was held and the above information confirmed.  The patient was taken to the operating room where she received spinal anesthesia. She was then placed in the dorsal supine position and prepped and draped in sterile fashion.   After an adequate timeout was performed, attention was turned to the patient's abdomen local analgesia was administered.  A small transverse skin incision was made under the umbilical fold. The incision was taken down to the layer of fascia using the scalpel, and fascia was incised, and extended bilaterally using Mayo scissors. The peritoneum was entered in a sharp fashion. Attention was then turned to the patient's uterus, and the right fallopian tube was identified and followed out to the fimbriated end. Two Kelley clamps were used to clamp across the fallopian tube mesosalpinx, incorporating the fimbriated end.  The fallopian tube was transected, and the  remaining ends were ligated with 0-Chromic.  There was some bleeding noted from the distal end of the mesosalpinx near the ovary, and this area was sutured with 3-0 Vicryl in a running fashion. Hemostasis was achieved. The instruments were then removed from the patient's abdomen and the fascial incision was repaired with 0 Vicryl, and the skin was closed with a 4-0 Monocryl subcuticular stitch. The incision was then injected with 10 ml of 0.5% Sensorcaine.  The patient tolerated the procedure well.  Instrument, sponge, and needle counts were correct times three.  The patient was then taken to the recovery room awake and in stable condition.  Estimated Blood Loss:  50      Drains: patient voided prior to procedure.          Total IV Fluids: 600 ml  Specimens:          Implants: None         Complications:  None; patient tolerated the procedure well.         Disposition: PACU - hemodynamically stable.         Condition: stable   Rubie Maid, MD [facility]

## 2022-02-21 NOTE — Progress Notes (Signed)
Spinal not completely resolved. Bladder scanned for 488mls. Orders per Dr Marcelline Mates for I&o cath. Able to minimally flex knees but no bladder sensation. Peri pad with moderate bleeding. Replaced with clean pad prior to transfer and fundus massaged. No c/o pain or discomfort. Tolerating fluids.

## 2022-02-21 NOTE — Plan of Care (Signed)
Pt in bed in low, locked position, call bell within reach. Family at the bedside. Pt breastfeeding baby. Fundus firm and bleeding stable. Pt pain well managed with scheduled and as needed medications. Ice packs, dibucaine, dermplast, and tuck given for peri comfort. Will continue to monitor.

## 2022-02-21 NOTE — Progress Notes (Addendum)
  Labor Progress Note   36 y.o. [id number] @ [redacted]w[redacted]d , admitted for  Pregnancy, Labor Management.   Subjective:  Comfortable with epidural. She is complete, fetal intolerance of pushing. Discussed recovery- bolus, position changes and possible need for assisted delivery. Patient verbalizes understanding.  Objective:  BP (!) 94/58   Pulse 71   Temp 98 F (36.7 C) (Oral)   Resp 16   Ht 4\' 11"  (1.499 m)   Wt 55 kg   LMP 05/17/2021   SpO2 100%   BMI 24.49 kg/m  Abd: gravid, ND, FHT present, mild tenderness on exam Extr: no edema SVE: CERVIX: 10 cm dilated, 100 effaced, +2 station  EFM: FHR: 120 bpm, variability: minimal/moderate,  accelerations:  Absent,  decelerations:  Present  late, early Toco: Frequency: Every 3-4 minutes Labs: I have reviewed the patient's lab results.   Assessment & Plan:  G3P1011 @ [redacted]w[redacted]d, admitted for  Pregnancy and Labor/Delivery Management  1. Pain management: epidural. 2. FWB: FHT category I/II.  3. ID: GBS negative 4. Labor management: hands and knees currently, fluid bolus, Dr Marcelline Mates notified by text requesting evaluation for vacuum- she is in the OR presently. Will call for other back up if needed. Improved tracing in hands and knees position.  All discussed with patient, see orders   Rod Can, [facility] 02/21/2022  9:16 AM

## 2022-02-21 NOTE — Transfer of Care (Signed)
Immediate Anesthesia Transfer of Care Note  Patient: Abigail Johnston  Procedure(s) Performed: POST PARTUM TUBAL LIGATION (Right: Abdomen)  Patient Location: PACU  Anesthesia Type:Spinal  Level of Consciousness: awake, alert  and oriented  Airway & Oxygen Therapy: Patient Spontanous Breathing  Post-op Assessment: Report given to RN and Post -op Vital signs reviewed and stable  Post vital signs: Reviewed and stable  Last Vitals:  Vitals Value Taken Time  BP 89/72 1448  Temp    Pulse 73   Resp 18   SpO2 99     Last Pain:  Vitals:   02/21/22 1247  TempSrc: Temporal  PainSc: 0-No pain         Complications: No notable events documented.

## 2022-02-22 ENCOUNTER — Encounter: Payer: Self-pay | Admitting: Obstetrics and Gynecology

## 2022-02-22 LAB — CBC
HCT: 31 % — ABNORMAL LOW (ref 36.0–46.0)
Hemoglobin: 10.2 g/dL — ABNORMAL LOW (ref 12.0–15.0)
MCH: 27.6 pg (ref 26.0–34.0)
MCHC: 32.9 g/dL (ref 30.0–36.0)
MCV: 83.8 fL (ref 80.0–100.0)
Platelets: 152 10*3/uL (ref 150–400)
RBC: 3.7 MIL/uL — ABNORMAL LOW (ref 3.87–5.11)
RDW: 15.9 % — ABNORMAL HIGH (ref 11.5–15.5)
WBC: 9.6 10*3/uL (ref 4.0–10.5)
nRBC: 0 % (ref 0.0–0.2)

## 2022-02-22 LAB — RPR: RPR Ser Ql: NONREACTIVE

## 2022-02-22 MED ORDER — IBUPROFEN 600 MG PO TABS: 600.0000 mg | ORAL_TABLET | Freq: Four times a day (QID) | ORAL | 0 refills | Status: DC

## 2022-02-22 NOTE — Anesthesia Postprocedure Evaluation (Signed)
Anesthesia Post Note  Patient: Abigail Johnston  Procedure(s) Performed: AN AD [place]  Patient location during evaluation: Mother Baby Anesthesia Type: Epidural Level of consciousness: awake, oriented and awake and alert Pain management: pain level controlled Vital Signs Assessment: post-procedure vital signs reviewed and stable Respiratory status: spontaneous breathing, respiratory function stable and nonlabored ventilation Cardiovascular status: blood pressure returned to baseline and stable Postop Assessment: no headache and no backache Anesthetic complications: no   No notable events documented.   Last Vitals:  Vitals:   02/21/22 2307 02/22/22 0327  BP: (!) 94/59 104/66  Pulse: 79 71  Resp: 18 20  Temp: 36.8 C 36.9 C  SpO2: 99% 98%    Last Pain:  Vitals:   02/22/22 0645  TempSrc:   PainSc: 5                  Proofreader

## 2022-02-23 LAB — SURGICAL PATHOLOGY

## 2022-02-26 NOTE — Anesthesia Postprocedure Evaluation (Signed)
Anesthesia Post Note  Patient: Abigail Johnston  Procedure(s) Performed: POST PARTUM TUBAL LIGATION (Right: Abdomen)  Patient location during evaluation: Mother Baby Anesthesia Type: Spinal Level of consciousness: oriented and awake and alert Pain management: pain level controlled Vital Signs Assessment: post-procedure vital signs reviewed and stable Respiratory status: spontaneous breathing and respiratory function stable Cardiovascular status: blood pressure returned to baseline and stable Postop Assessment: no headache, no backache, no apparent nausea or vomiting and able to ambulate Anesthetic complications: no   No notable events documented.   Last Vitals:  Vitals:   02/22/22 0327 02/22/22 0757  BP: 104/66 105/63  Pulse: 71 68  Resp: 20 18  Temp: 36.9 C 36.8 C  SpO2: 98% 99%    Last Pain:  Vitals:   02/22/22 0752  TempSrc:   PainSc: 3                  Martha Clan

## 2022-04-10 ENCOUNTER — Encounter: Payer: Self-pay | Admitting: Advanced Practice Midwife

## 2022-04-10 ENCOUNTER — Ambulatory Visit (INDEPENDENT_AMBULATORY_CARE_PROVIDER_SITE_OTHER): Payer: BC Managed Care – PPO | Admitting: Advanced Practice Midwife

## 2022-04-10 NOTE — Progress Notes (Signed)
Postpartum Visit  Chief Complaint:  Chief Complaint  Patient presents with   Postpartum Care    History of Present Illness: Patient is a 36 y.o. [id number] presents for postpartum visit.  Review the Delivery Report for details.   Date of delivery: 02/21/2022 Type of delivery: Vaginal delivery - Vacuum or forceps assisted  no Episiotomy No.  Laceration: first degree  Pregnancy or labor problems:  no Any problems since the delivery:  decreased appetite  Newborn Details:  SINGLETON :  1. BabyGender female. Birth weight:   Maternal Details:  Breast or formula feeding: formula feeding Intercourse: No  Contraception after delivery:  salpingectomy Any bowel or bladder issues: No  Post partum depression/anxiety noted:  no Edinburgh Post-Partum Depression Score: 2 Date of last PAP:   no abnormalities   Review of Systems: Review of Systems  Constitutional:  Negative for chills and fever.  HENT:  Negative for congestion, ear discharge, ear pain, hearing loss, sinus pain and sore throat.   Eyes:  Negative for blurred vision and double vision.  Respiratory:  Negative for cough, shortness of breath and wheezing.   Cardiovascular:  Negative for chest pain, palpitations and leg swelling.  Gastrointestinal:  Negative for abdominal pain, blood in stool, constipation, diarrhea, heartburn, melena, nausea and vomiting.       Positive for decreased appetite  Genitourinary:  Negative for dysuria, flank pain, frequency, hematuria and urgency.  Musculoskeletal:  Negative for back pain, joint pain and myalgias.  Skin:  Negative for itching and rash.  Neurological:  Negative for dizziness, tingling, tremors, sensory change, speech change, focal weakness, seizures, loss of consciousness, weakness and headaches.  Endo/Heme/Allergies:  Negative for environmental allergies. Does not bruise/bleed easily.  Psychiatric/Behavioral:  Negative for depression, hallucinations, memory loss, substance abuse  and suicidal ideas. The patient is not nervous/anxious and does not have insomnia.      Past Medical History:  Past Medical History:  Diagnosis Date   Headache    Migraine headache with aura     Past Surgical History:  Past Surgical History:  Procedure Laterality Date   LAPAROSCOPY Left 01/19/2021   Procedure: LAPAROSCOPY DIAGNOSTIC LEFT SALPINGECTOMY WITH REMOVAL OF ECTOPIC;  Surgeon: Hildred Laser, MD;  Location: [facility] ORS;  Service: Gynecology;  Laterality: Left;   TUBAL LIGATION Right 02/21/2022   Procedure: POST PARTUM TUBAL LIGATION;  Surgeon: Hildred Laser, MD;  Location: [facility] ORS;  Service: Gynecology;  Laterality: Right;   WISDOM TOOTH EXTRACTION      Family History:  Family History  Problem Relation Age of Onset   Healthy Mother    Healthy Father    Breast cancer Maternal Grandmother        deceased in late 4s   Cancer Maternal Uncle        unknown type    Social History:  Social History   Socioeconomic History   Marital status: Single    Spouse name: Not on file   Number of children: Not on file   Years of education: Not on file   Highest education level: Not on file  Occupational History   Not on file  Tobacco Use   Smoking status: Never    Passive exposure: Never   Smokeless tobacco: Never  Vaping Use   Vaping Use: Never used  Substance and Sexual Activity   Alcohol use: No   Drug use: No   Sexual activity: Not Currently    Birth control/protection: Surgical  Other Topics Concern  Not on file  Social History Narrative   Not on file   Social Determinants of Health   Financial Resource Strain: Not on file  Food Insecurity: No Food Insecurity (02/21/2022)   Hunger Vital Sign    Worried About Running Out of Food in the Last Year: Never true    Ran Out of Food in the Last Year: Never true  Transportation Needs: Not on file  Physical Activity: Not on file  Stress: Not on file  Social Connections: Not on file  Intimate Partner Violence: Not  on file    Allergies:  No Known Allergies  Medications: Prior to Admission medications   Medication Sig Start Date End Date Taking? Authorizing Provider  Prenatal Vit-Fe Fumarate-FA (MULTIVITAMIN-PRENATAL) 27-0.8 MG TABS tablet Take 1 tablet by mouth daily at 12 noon. Patient not taking: Reported on 04/10/2022    [provider]    Physical Exam Blood pressure 90/60, height 4\' 11"  (1.499 m), weight 99 lb (44.9 kg), last menstrual period 04/05/2022, not currently breastfeeding.    General: NAD HEENT: normocephalic, anicteric Pulmonary: No increased work of breathing Abdomen: NABS, soft, non-tender, non-distended.  Umbilicus without lesions.  No hepatomegaly, splenomegaly or masses palpable. No evidence of hernia. Genitourinary:  External: Normal external female genitalia.  Normal urethral meatus, normal Bartholin's and Skene's glands.    Vagina: Normal vaginal mucosa, no evidence of prolapse.    Cervix: Grossly normal in appearance, no bleeding  Uterus: Non-enlarged, mobile, normal contour. No CMT  Adnexa: ovaries non-enlarged, no adnexal masses  Rectal: deferred Extremities: no edema, erythema, or tenderness Neurologic: Grossly intact Psychiatric: mood appropriate, affect full   Edinburgh Postnatal Depression Scale - 04/10/22 1111       Edinburgh Postnatal Depression Scale:  In the Past 7 Days   I have been able to laugh and see the funny side of things. 0    I have looked forward with enjoyment to things. 0    I have blamed myself unnecessarily when things went wrong. 0    I have been anxious or worried for no good reason. 0    I have felt scared or panicky for no good reason. 0    Things have been getting on top of me. 1    I have been so unhappy that I have had difficulty sleeping. 0    I have felt sad or miserable. 1    I have been so unhappy that I have been crying. 0    The thought of harming myself has occurred to me. 0    Edinburgh Postnatal Depression  Scale Total 2             Assessment: 36 y.o. 30 presenting for 6 week postpartum visit  Plan: Problem List Items Addressed This Visit   None Visit Diagnoses     6 weeks postpartum follow-up    -  Primary        1) Contraception - Education given regarding options for contraception, as well as compatibility with breast feeding if applicable.  Patient plans on  salpingectomy  for contraception.  2)  Pap ASCCP guidelines and rational discussed.  Patient opts for every 3 years screening interval  3) Patient underwent screening for postpartum depression with no signs of depression  4) Return in about 1 year (around 04/11/2023) for annual established gyn.   14/09/2022, CNM [facility] 04/10/2022, 1:36 PM

## 2023-03-28 IMAGING — US US OB COMP LESS 14 WK
1 series · 13 of 28 positions shown · non-contrast
Comparison: None.

CLINICAL DATA: Vaginal bleeding for 2-3 days. Pain for 5 days.
[place] of [address]. Gestational age by last menstrual
period of 9 weeks and 3 days. Last menstrual period 11/14/2020.
Estimated due date 08/21/2021.

EXAM:
OBSTETRIC <14 WK US AND TRANSVAGINAL OB US
TECHNIQUE: Both transabdominal and transvaginal ultrasound examinations were
performed for complete evaluation of the gestation as well as the
maternal uterus, adnexal regions, and pelvic cul-de-sac.
Transvaginal technique was performed to assess early pregnancy.

[Series 1: us ob comp less 14 wks · 128 acquisitions, 13 frames shown]
[im 5/128]
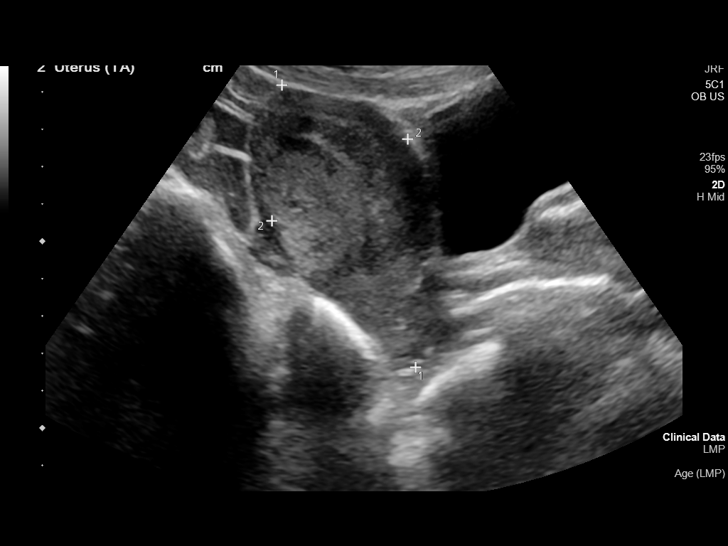
[im 15/128]
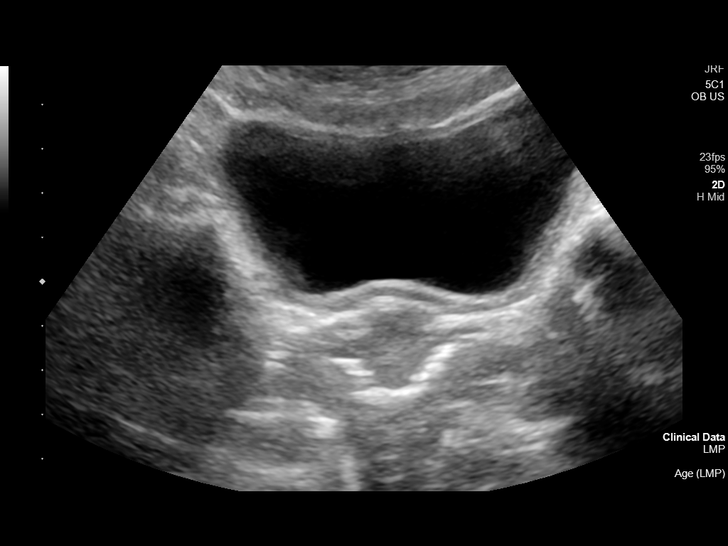
[im 24/128]
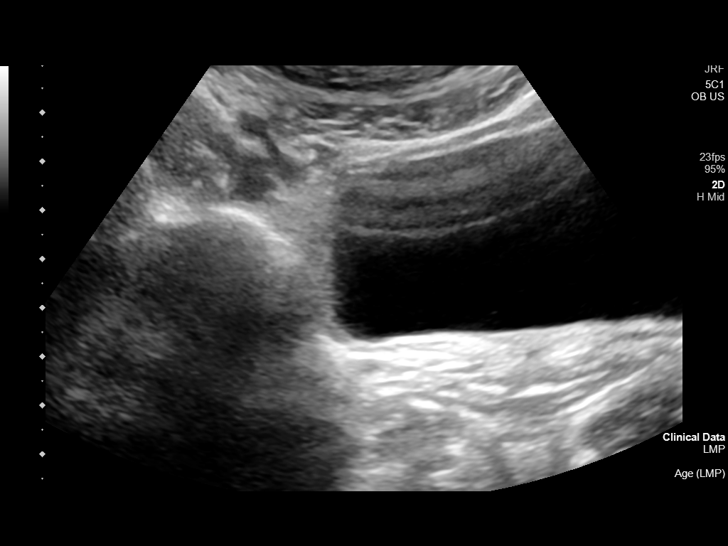
[im 33/128]
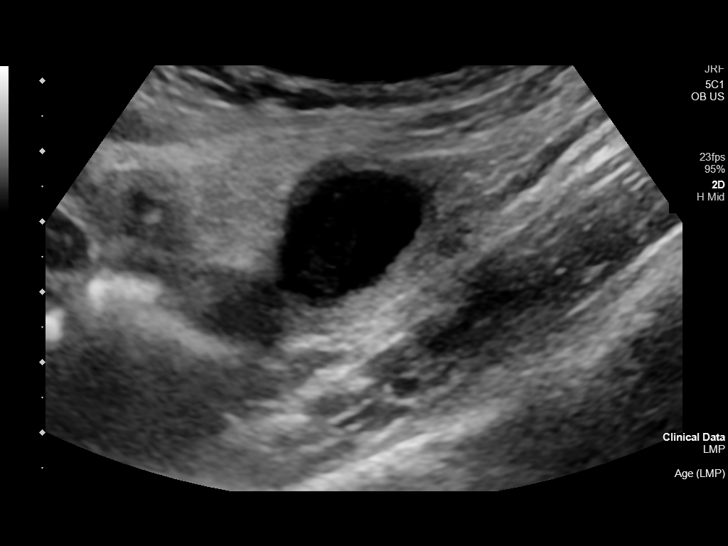
[im 43/128]
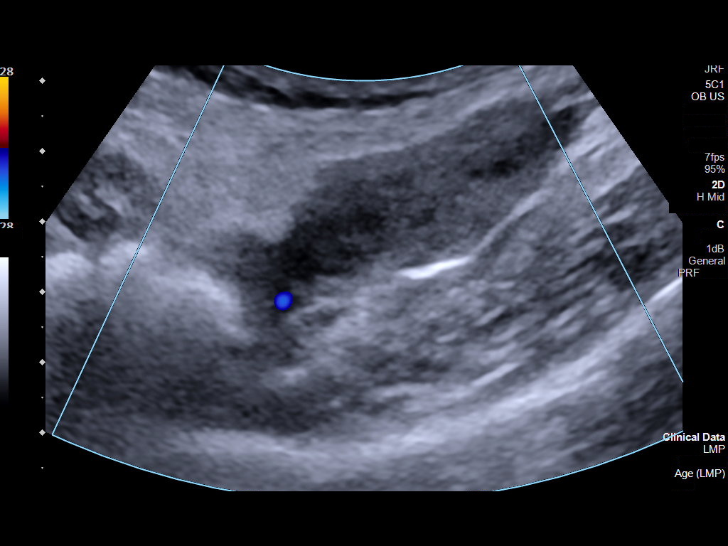
[im 52/128]
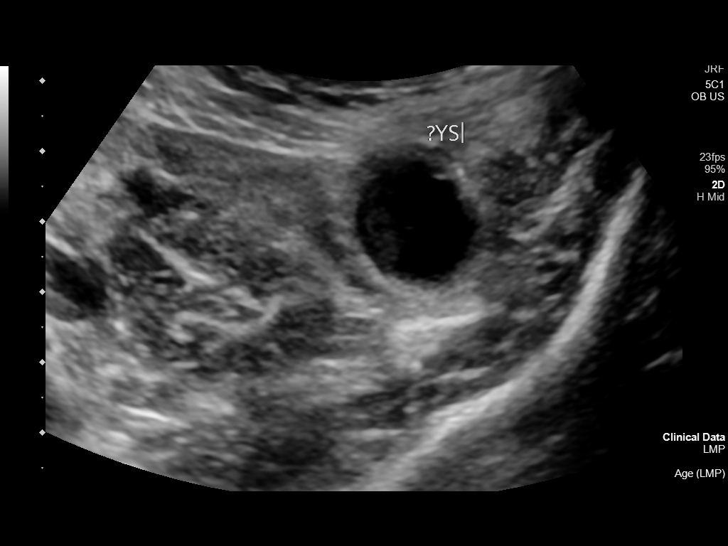
[im 66/128]
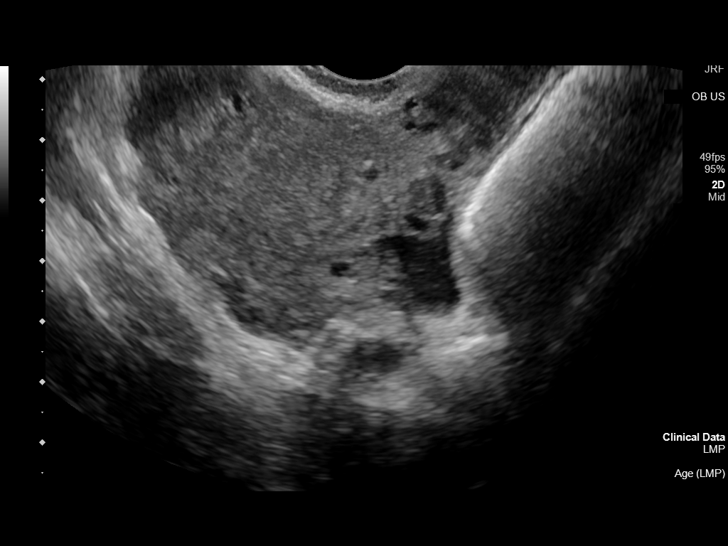
[im 76/128]
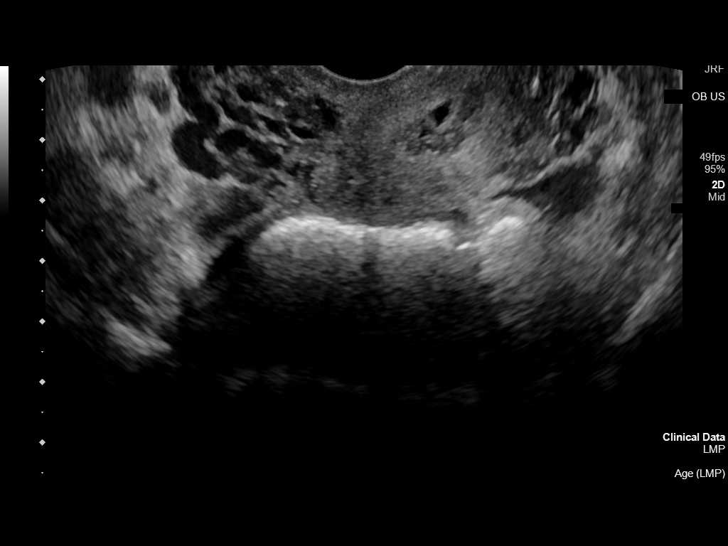
[im 85/128]
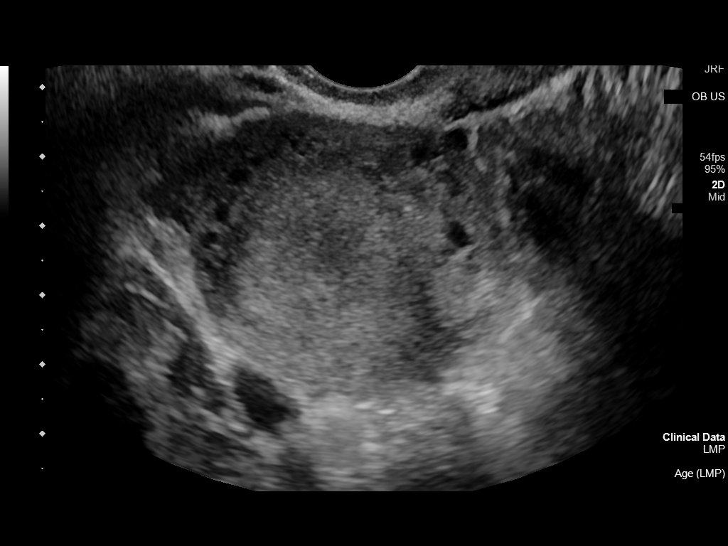
[im 95/128]
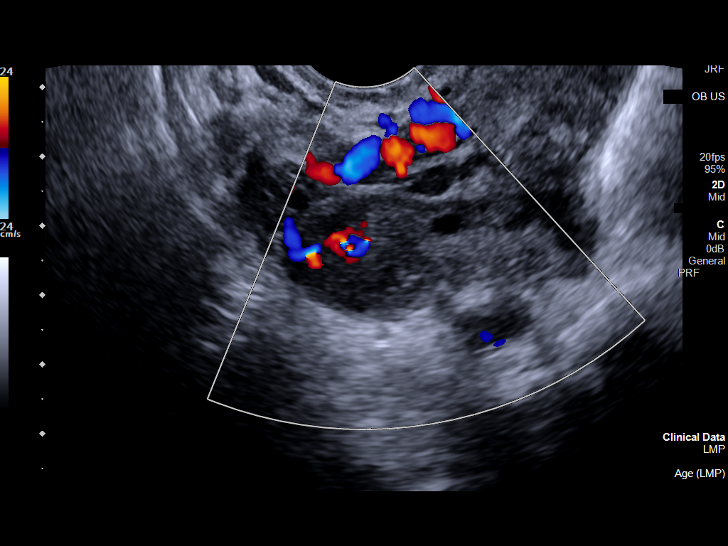
[im 104/128]
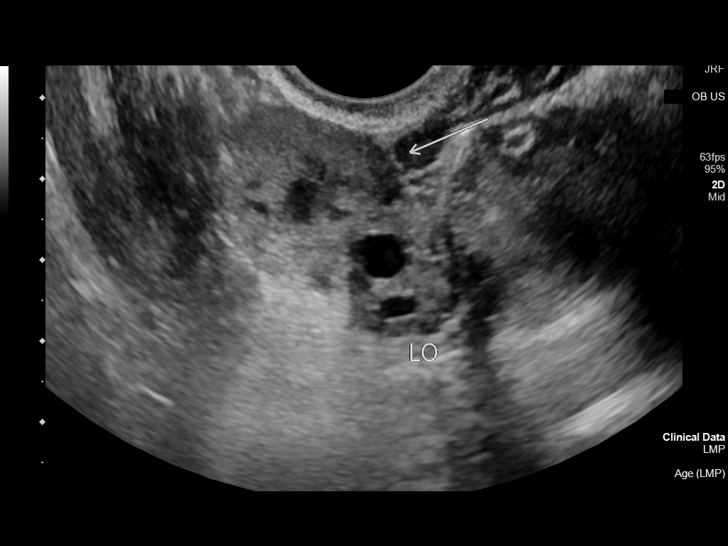
[im 113/128]
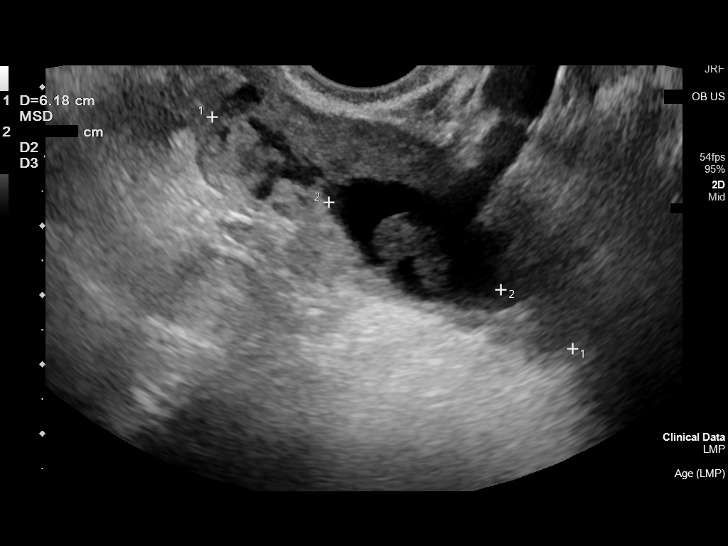
[im 123/128]
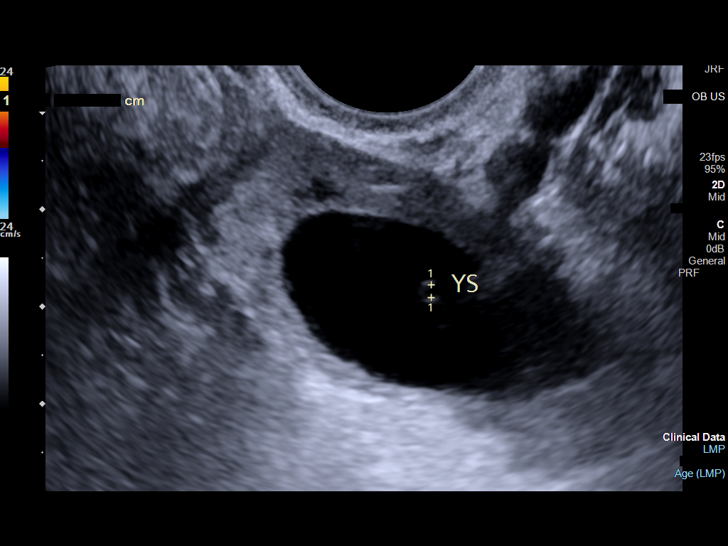

[13 of 28 positions shown; findings below may reference images not displayed]

FINDINGS: Intrauterine gestational sac: None

Yolk sac:  Visualized within the left adnexal ectopic pregnancy.

Embryo:  Visualized within the left adnexal ectopic pregnancy.

Cardiac Activity: Not Visualized.

CRL:  14.2 mm   7 w   5 d                  US EDC: 09/02/2021

Subchorionic hemorrhage:  None visualized.

Maternal uterus/adnexae:

Uterus is unremarkable with an endometrium of 15 mm.

Left ovary is unremarkable; however, there is a separate adnexal
mass identified demonstrating a gestational sac, yolk sac, and
embryo. No cardiac activity identified.

The right ovary is unremarkable.  No right adnexal mass.

Other: Trace simple free fluid within the pelvis.
IMPRESSION: Left adnexal ectopic pregnancy with embryo, gestational age of 7
weeks and 5 days. No findings to suggest rupture.

These results were called by telephone at the time of interpretation
on 01/19/2021 at [DATE] to provider JAZLAAN MON AMAR , who verbally
acknowledged these results.

## 2023-05-22 ENCOUNTER — Encounter: Payer: Self-pay | Admitting: Obstetrics and Gynecology

## 2023-05-22 ENCOUNTER — Ambulatory Visit: Payer: 59 | Admitting: Obstetrics and Gynecology

## 2023-05-22 ENCOUNTER — Other Ambulatory Visit (HOSPITAL_COMMUNITY)
Admission: RE | Admit: 2023-05-22 | Discharge: 2023-05-22 | Disposition: A | Discharge status: Home / Self Care | Payer: 59 | Source: Ambulatory Visit | Attending: Obstetrics and Gynecology | Admitting: Obstetrics and Gynecology

## 2023-05-22 VITALS — BP 85/58 | HR 68 | Resp 16 | Ht 59.5 in | Wt 101.7 lb

## 2023-05-22 DIAGNOSIS — N898 Other specified noninflammatory disorders of vagina: Secondary | ICD-10-CM

## 2023-05-22 DIAGNOSIS — Z01419 Encounter for gynecological examination (general) (routine) without abnormal findings: Secondary | ICD-10-CM | POA: Diagnosis present

## 2023-05-22 DIAGNOSIS — Z131 Encounter for screening for diabetes mellitus: Secondary | ICD-10-CM

## 2023-05-22 DIAGNOSIS — Z13220 Encounter for screening for lipoid disorders: Secondary | ICD-10-CM

## 2023-05-22 NOTE — Patient Instructions (Addendum)
 Preventive Care 55-39 Years Old, Female Preventive care refers to lifestyle choices and visits with your health care provider that can promote health and wellness. Preventive care visits are also called wellness exams. What can I expect for my preventive care visit? Counseling During your preventive care visit, your health care provider may ask about your: Medical history, including: Past medical problems. Family medical history. Pregnancy history. Current health, including: Menstrual cycle. Method of birth control. Emotional well-being. Home life and relationship well-being. Sexual activity and sexual health. Lifestyle, including: Alcohol, nicotine or tobacco, and drug use. Access to firearms. Diet, exercise, and sleep habits. Work and work Astronomer. Sunscreen use. Safety issues such as seatbelt and bike helmet use. Physical exam Your health care provider may check your: Height and weight. These may be used to calculate your BMI (body mass index). BMI is a measurement that tells if you are at a healthy weight. Waist circumference. This measures the distance around your waistline. This measurement also tells if you are at a healthy weight and may help predict your risk of certain diseases, such as type 2 diabetes and high blood pressure. Heart rate and blood pressure. Body temperature. Skin for abnormal spots. What immunizations do I need?  Vaccines are usually given at various ages, according to a schedule. Your health care provider will recommend vaccines for you based on your age, medical history, and lifestyle or other factors, such as travel or where you work. What tests do I need? Screening Your health care provider may recommend screening tests for certain conditions. This may include: Pelvic exam and Pap test. Lipid and cholesterol levels. Diabetes screening. This is done by checking your blood sugar (glucose) after you have not eaten for a while (fasting). Hepatitis  B test. Hepatitis C test. HIV (human immunodeficiency virus) test. STI (sexually transmitted infection) testing, if you are at risk. BRCA-related cancer screening. This may be done if you have a family history of breast, ovarian, tubal, or peritoneal cancers. Talk with your health care provider about your test results, treatment options, and if necessary, the need for more tests. Follow these instructions at home: Eating and drinking  Eat a healthy diet that includes fresh fruits and vegetables, whole grains, lean protein, and low-fat dairy products. Take vitamin and mineral supplements as recommended by your health care provider. Do not drink alcohol if: Your health care provider tells you not to drink. You are pregnant, may be pregnant, or are planning to become pregnant. If you drink alcohol: Limit how much you have to 0-1 drink a day. Know how much alcohol is in your drink. In the [place] one drink equals one 12 oz bottle of beer (355 mL), one 5 oz glass of wine (148 mL), or one 1 oz glass of hard liquor (44 mL). Lifestyle Brush your teeth every morning and night with fluoride toothpaste. Floss one time each day. Exercise for at least 30 minutes 5 or more days each week. Do not use any products that contain nicotine or tobacco. These products include cigarettes, chewing tobacco, and vaping devices, such as e-cigarettes. If you need help quitting, ask your health care provider. Do not use drugs. If you are sexually active, practice safe sex. Use a condom or other form of protection to prevent STIs. If you do not wish to become pregnant, use a form of birth control. If you plan to become pregnant, see your health care provider for a prepregnancy visit. Find healthy ways to manage stress, such as: Meditation,  yoga, or listening to music. Journaling. Talking to a trusted person. Spending time with friends and family. Minimize exposure to UV radiation to reduce your risk of skin  cancer. Safety Always wear your seat belt while driving or riding in a vehicle. Do not drive: If you have been drinking alcohol. Do not ride with someone who has been drinking. If you have been using any mind-altering substances or drugs. While texting. When you are tired or distracted. Wear a helmet and other protective equipment during sports activities. If you have firearms in your house, make sure you follow all gun safety procedures. Seek help if you have been physically or sexually abused. What's next? Go to your health care provider once a year for an annual wellness visit. Ask your health care provider how often you should have your eyes and teeth checked. Stay up to date on all vaccines. This information is not intended to replace advice given to you by your health care provider. Make sure you discuss any questions you have with your health care provider. Document Revised: 10/19/2020 Document Reviewed: 10/19/2020 Elsevier Patient Education  2024 [employer] Breast Self-Awareness Breast self-awareness is knowing how your breasts look and feel. You need to: Check your breasts on a regular basis. Tell your doctor about any changes. Become familiar with the look and feel of your breasts. This can help you catch a breast problem while it is still small and can be treated. You should do breast self-exams even if you have breast implants. What you need: A mirror. A well-lit room. A pillow or other soft object. How to do a breast self-exam Follow these steps to do a breast self-exam: Look for changes  Take off all the clothes above your waist. Stand in front of a mirror in a room with good lighting. Put your hands down at your sides. Compare your breasts in the mirror. Look for any difference between them, such as: A difference in shape. A difference in size. Wrinkles, dips, and bumps in one breast and not the other. Look at each breast for changes in the skin, such  as: Redness. Scaly areas. Skin that has gotten thicker. Dimpling. Open sores (ulcers). Look for changes in your nipples, such as: Fluid coming out of a nipple. Fluid around a nipple. Bleeding. Dimpling. Redness. A nipple that looks pushed in (retracted), or that has changed position. Feel for changes Lie on your back. Feel each breast. To do this: Pick a breast to feel. Place a pillow under the shoulder closest to that breast. Put the arm closest to that breast behind your head. Feel the nipple area of that breast using the hand of your other arm. Feel the area with the pads of your three middle fingers by making small circles with your fingers. Use light, medium, and firm pressure. Continue the overlapping circles, moving downward over the breast. Keep making circles with your fingers. Stop when you feel your ribs. Start making circles with your fingers again, this time going upward until you reach your collarbone. Then, make circles outward across your breast and into your armpit area. Squeeze your nipple. Check for discharge and lumps. Repeat these steps to check your other breast. Sit or stand in the tub or shower. With soapy water on your skin, feel each breast the same way you did when you were lying down. Write down what you find Writing down what you find can help you remember what to tell your doctor. Write down: What is  normal for each breast. Any changes you find in each breast. These include: The kind of changes you find. A tender or painful breast. Any lump you find. Write down its size and where it is. When you last had your monthly period (menstrual cycle). General tips If you are breastfeeding, the best time to check your breasts is after you feed your baby or after you use a breast pump. If you get monthly bleeding, the best time to check your breasts is 5-7 days after your monthly cycle ends. With time, you will become comfortable with the self-exam. You will  also start to know if there are changes in your breasts. Contact a doctor if: You see a change in the shape or size of your breasts or nipples. You see a change in the skin of your breast or nipples, such as red or scaly skin. You have fluid coming from your nipples that is not normal. You find a new lump or thick area. You have breast pain. You have any concerns about your breast health. Summary Breast self-awareness includes looking for changes in your breasts and feeling for changes within your breasts. You should do breast self-awareness in front of a mirror in a well-lit room. If you get monthly periods (menstrual cycles), the best time to check your breasts is 5-7 days after your period ends. Tell your doctor about any changes you see in your breasts. Changes include changes in size, changes on the skin, painful or tender breasts, or fluid from your nipples that is not normal. This information is not intended to replace advice given to you by your health care provider. Make sure you discuss any questions you have with your health care provider. Document Revised: 09/28/2021 Document Reviewed: 02/23/2021 Elsevier Patient Education  2024 [employer].

## 2023-05-22 NOTE — Progress Notes (Signed)
 GYNECOLOGY ANNUAL PHYSICAL EXAM PROGRESS NOTE  Subjective:    ARRYN Johnston is a 38 y.o. G26P2012 female who presents for an annual exam.  The patient is sexually active. The patient participates in regular exercise: no. Has the patient ever been transfused or tattooed?: no. The patient reports that there is not domestic violence in her life.   The patient has the following complaints today: None  Menstrual History: Menarche age: 89 Patient's last menstrual period was 05/15/2023 (exact date). Period Cycle (Days): 28 Period Duration (Days): 4-5 Period Pattern: Regular Menstrual Flow: Heavy, Moderate Menstrual Control: Maxi pad, Tampon Menstrual Control Change Freq (Hours): 3-4 Dysmenorrhea: None   Gynecologic History:  Contraception: tubal ligation History of STI's: Denies Last Pap: 08/30/2021. Results were: normal. Denies h/o abnormal pap smears. Last mammogram: Not age appropriate    OB History  Gravida Para Term Preterm AB Living  3 2 2  0 1 2  SAB IAB Ectopic Multiple Live Births  0 0 0 0 2    # Outcome Date GA Lbr Len/2nd Weight Sex Type Anes PTL Lv  3 Term 02/21/22 [redacted]w[redacted]d 05:23 / 01:43 6 lb 10.9 oz (3.03 kg) F Vag-Spont EPI  LIV     Name: Abigail Johnston     Apgar1: 4  Apgar5: 8  2 Term 10/04/19 [redacted]w[redacted]d  5 lb 1.8 oz (2.32 kg) F Vag-Vacuum EPI  LIV     Name: Abigail Johnston     Apgar1: 8  Apgar5: 9  1 AB 2012            Past Medical History:  Diagnosis Date   Headache    Migraine headache with aura     Past Surgical History:  Procedure Laterality Date   LAPAROSCOPY Left 01/19/2021   Procedure: LAPAROSCOPY DIAGNOSTIC LEFT SALPINGECTOMY WITH REMOVAL OF ECTOPIC;  Surgeon: Teresa Fender, MD;  Location: [facility];  Service: Gynecology;  Laterality: Left;   TUBAL LIGATION Right 02/21/2022   Procedure: POST PARTUM TUBAL LIGATION;  Surgeon: Teresa Fender, MD;  Location: [facility];  Service: Gynecology;  Laterality: Right;   WISDOM TOOTH EXTRACTION       Family History  Problem Relation Age of Onset   Healthy Mother    Healthy Father    Breast cancer Maternal Grandmother        deceased in late 72s   Cancer Maternal Uncle        unknown type    Social History   Socioeconomic History   Marital status: Single    Spouse name: Not on file   Number of children: Not on file   Years of education: Not on file   Highest education level: Not on file  Occupational History   Not on file  Tobacco Use   Smoking status: Never    Passive exposure: Never   Smokeless tobacco: Never  Vaping Use   Vaping status: Never Used  Substance and Sexual Activity   Alcohol use: No   Drug use: No   Sexual activity: Not Currently    Birth control/protection: Surgical  Other Topics Concern   Not on file  Social History Narrative   Not on file   Social Drivers of Health   Financial Resource Strain: Not on file  Food Insecurity: No Food Insecurity (02/21/2022)   Hunger Vital Sign    Worried About Running Out of Food in the Last Year: Never true    Ran Out of Food in the Last Year: Never true  Transportation Needs: Not on file  Physical Activity: Not on file  Stress: Not on file  Social Connections: Not on file  Intimate Partner Violence: Not on file    Current Outpatient Medications on File Prior to Visit  Medication Sig Dispense Refill   Prenatal Vit-Fe Fumarate-FA (MULTIVITAMIN-PRENATAL) 27-0.8 MG TABS tablet Take 1 tablet by mouth daily at 12 noon. (Patient not taking: Reported on 04/10/2022)     No current facility-administered medications on file prior to visit.    No Known Allergies   Review of Systems Constitutional: negative for chills, fatigue, fevers and sweats Eyes: negative for irritation, redness and visual disturbance Ears, nose, mouth, throat, and face: negative for hearing loss, nasal congestion, snoring and tinnitus Respiratory: negative for asthma, cough, sputum Cardiovascular: negative for chest pain, dyspnea,  exertional chest pressure/discomfort, irregular heart beat, palpitations and syncope Gastrointestinal: negative for abdominal pain, change in bowel habits, nausea and vomiting Genitourinary: negative for abnormal menstrual periods, genital lesions, sexual problems, dysuria and urinary incontinence. Positive for occasional vaginal odor with discharge. Denies vaginal itching or burning.  Integument/breast: negative for breast lump, breast tenderness and nipple discharge Hematologic/lymphatic: negative for bleeding and easy bruising Musculoskeletal:negative for back pain and muscle weakness Neurological: negative for dizziness, headaches, vertigo and weakness Endocrine: negative for diabetic symptoms including polydipsia, polyuria and skin dryness Allergic/Immunologic: negative for hay fever and urticaria      Objective:  Blood pressure (!) 85/58, pulse 68, resp. rate 16, height 4' 11.5" (1.511 m), weight 101 lb 11.2 oz (46.1 kg), last menstrual period 05/15/2023, not currently breastfeeding. Body mass index is 20.2 kg/m.    General Appearance:    Alert, cooperative, no distress, appears stated age  Head:    Normocephalic, without obvious abnormality, atraumatic  Eyes:    PERRL, conjunctiva/corneas clear, EOM's intact, both eyes  Ears:    Normal external ear canals, both ears  Nose:   Nares normal, septum midline, mucosa normal, no drainage or sinus tenderness  Throat:   Lips, mucosa, and tongue normal; teeth and gums normal  Neck:   Supple, symmetrical, trachea midline, no adenopathy; thyroid: no enlargement/tenderness/nodules; no carotid bruit or JVD  Back:     Symmetric, no curvature, ROM normal, no CVA tenderness  Lungs:     Clear to auscultation bilaterally, respirations unlabored  Chest Wall:    No tenderness or deformity   Heart:    Regular rate and rhythm, S1 and S2 normal, no murmur, rub or gallop  Breast Exam:    No tenderness, masses, or nipple abnormality  Abdomen:     Soft,  non-tender, bowel sounds active all four quadrants, no masses, no organomegaly.    Genitalia:    Pelvic:external genitalia normal, vagina without lesions, discharge, or tenderness, rectovaginal septum  normal. Cervix normal in appearance, no cervical motion tenderness, no adnexal masses or tenderness.  Uterus normal size, shape, mobile, regular contours, nontender.  Rectal:    Normal external sphincter.  No hemorrhoids appreciated. Internal exam not done.   Extremities:   Extremities normal, atraumatic, no cyanosis or edema  Pulses:   2+ and symmetric all extremities  Skin:   Skin color, texture, turgor normal, no rashes or lesions  Lymph nodes:   Cervical, supraclavicular, and axillary nodes normal  Neurologic:   CNII-XII intact, normal strength, sensation and reflexes throughout   .  Labs:  Lab Results  Component Value Date   WBC 9.6 02/22/2022   HGB 10.2 (L) 02/22/2022   HCT 31.0 (L)  02/22/2022   MCV 83.8 02/22/2022   PLT 152 02/22/2022    Lab Results  Component Value Date   CREATININE 0.60 08/05/2021   BUN 15 08/05/2021   NA 133 (L) 08/05/2021   K 3.5 08/05/2021   CL 104 08/05/2021   CO2 22 08/05/2021    Lab Results  Component Value Date   ALT 10 08/05/2021   AST 16 08/05/2021   ALKPHOS 35 (L) 08/05/2021   BILITOT 1.2 08/05/2021    No results found for: "TSH"   Assessment:   1. Encounter for well woman exam with routine gynecological exam   2. Screening for diabetes mellitus (DM)   3. Screening cholesterol level   4. Vaginal odor      Plan:  Blood tests: see orders. Breast self exam technique reviewed and patient encouraged to perform self-exam monthly. Contraception: tubal ligation. Discussed healthy lifestyle modifications. Mammogram  : Not age appropriate . To begin screens at age 78.  Pap smear  UTD . Flu vaccine: Declined Vaginal culture performed for vaginal odor.  Follow up in 1 year for annual exam   Teresa Fender, MD [facility]

## 2023-05-23 LAB — COMPREHENSIVE METABOLIC PANEL
ALT: 12 [IU]/L (ref 0–32)
AST: 18 [IU]/L (ref 0–40)
Albumin: 4.6 g/dL (ref 3.9–4.9)
Alkaline Phosphatase: 44 [IU]/L (ref 44–121)
BUN/Creatinine Ratio: 17 (ref 9–23)
BUN: 13 mg/dL (ref 6–20)
Bilirubin Total: 1.3 mg/dL — ABNORMAL HIGH (ref 0.0–1.2)
CO2: 22 mmol/L (ref 20–29)
Calcium: 9.2 mg/dL (ref 8.7–10.2)
Chloride: 103 mmol/L (ref 96–106)
Creatinine, Ser: 0.77 mg/dL (ref 0.57–1.00)
Globulin, Total: 2.5 g/dL (ref 1.5–4.5)
Glucose: 81 mg/dL (ref 70–99)
Potassium: 4.1 mmol/L (ref 3.5–5.2)
Sodium: 138 mmol/L (ref 134–144)
Total Protein: 7.1 g/dL (ref 6.0–8.5)
eGFR: 101 mL/min/{1.73_m2} (ref >59)

## 2023-05-23 LAB — CBC
Hematocrit: 40.2 % (ref 34.0–46.6)
Hemoglobin: 13.4 g/dL (ref 11.1–15.9)
MCH: 31.9 pg (ref 26.6–33.0)
MCHC: 33.3 g/dL (ref 31.5–35.7)
MCV: 96 fL (ref 79–97)
Platelets: 261 10*3/uL (ref 150–450)
RBC: 4.2 x10E6/uL (ref 3.77–5.28)
RDW: 12.4 % (ref 11.7–15.4)
WBC: 6.5 10*3/uL (ref 3.4–10.8)

## 2023-05-23 LAB — LIPID PANEL
Chol/HDL Ratio: 2.9 {ratio} (ref 0.0–4.4)
Cholesterol, Total: 229 mg/dL — ABNORMAL HIGH (ref 100–199)
HDL: 80 mg/dL (ref >39)
LDL Chol Calc (NIH): 139 mg/dL — ABNORMAL HIGH (ref 0–99)
Triglycerides: 57 mg/dL (ref 0–149)
VLDL Cholesterol Cal: 10 mg/dL (ref 5–40)

## 2023-05-23 LAB — TSH: TSH: 0.928 u[IU]/mL (ref 0.450–4.500)

## 2023-05-23 LAB — HEMOGLOBIN A1C
Est. average glucose Bld gHb Est-mCnc: 111 mg/dL
Hgb A1c MFr Bld: 5.5 % (ref 4.8–5.6)

## 2023-05-24 LAB — CERVICOVAGINAL ANCILLARY ONLY
Bacterial Vaginitis (gardnerella): POSITIVE — AB
Candida Glabrata: NEGATIVE
Candida Vaginitis: POSITIVE — AB
Comment: NEGATIVE
Comment: NEGATIVE
Comment: NEGATIVE

## 2023-05-26 ENCOUNTER — Encounter: Payer: Self-pay | Admitting: Obstetrics and Gynecology

## 2023-05-26 ENCOUNTER — Other Ambulatory Visit: Payer: Self-pay | Admitting: Obstetrics and Gynecology

## 2023-05-26 DIAGNOSIS — B9689 Other specified bacterial agents as the cause of diseases classified elsewhere: Secondary | ICD-10-CM

## 2023-05-26 MED ORDER — METRONIDAZOLE 500 MG PO TABS: 500.0000 mg | ORAL_TABLET | Freq: Two times a day (BID) | ORAL | 0 refills | Status: AC

## 2023-10-12 IMAGING — US US OB < 14 WEEKS - US OB TV
1 series · 14 of 28 positions shown · non-contrast
Comparison: None.

CLINICAL DATA: Vaginal

EXAM:
OBSTETRIC <14 WK US AND TRANSVAGINAL OB US
TECHNIQUE: Both transabdominal and transvaginal ultrasound examinations were
performed for complete evaluation of the gestation as well as the
maternal uterus, adnexal regions, and pelvic cul-de-sac.
Transvaginal technique was performed to assess early pregnancy.

[Series 1: us ob less than 14 weeks with ob transvaginal · 14 of 46 slices shown]
[im 2/46]
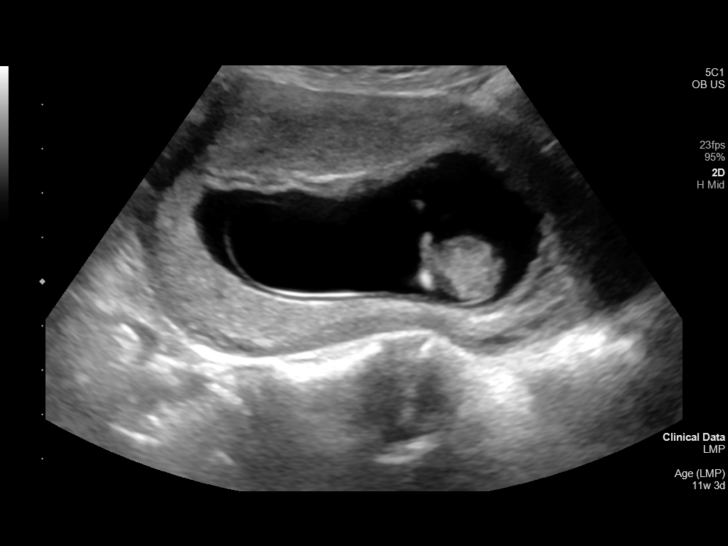
[im 6/46]
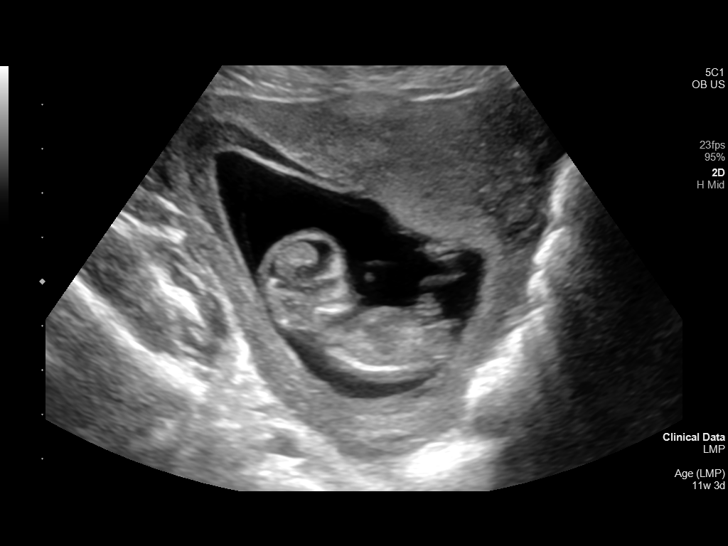
[im 9/46]
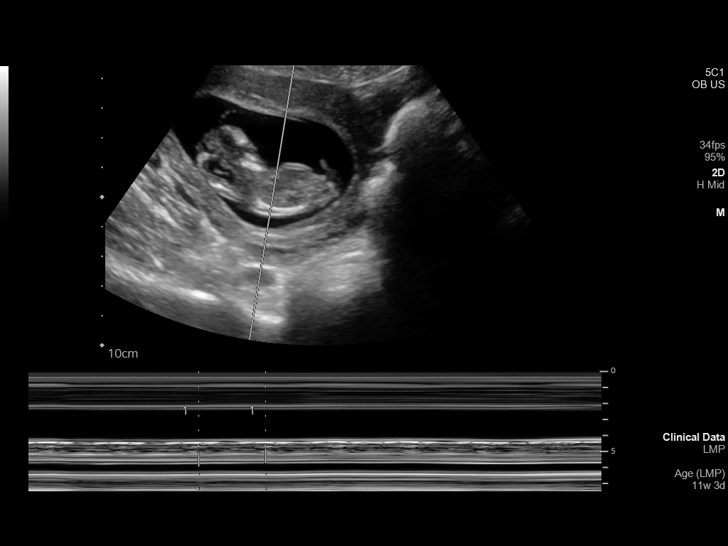
[im 12/46]
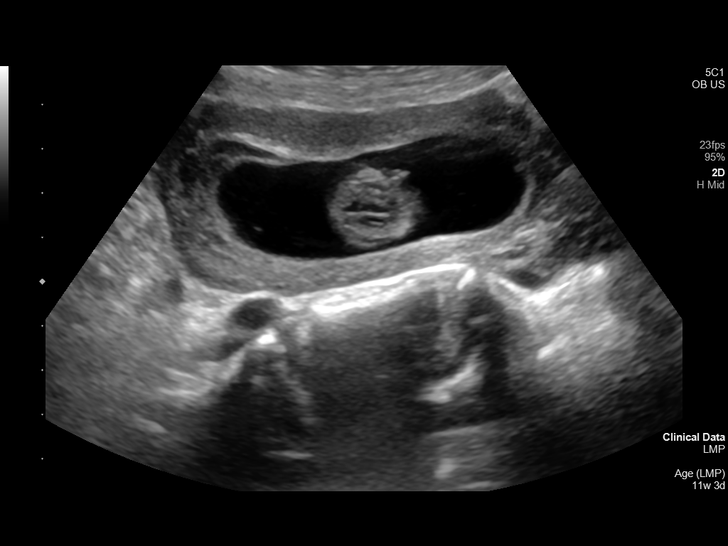
[im 16/46]
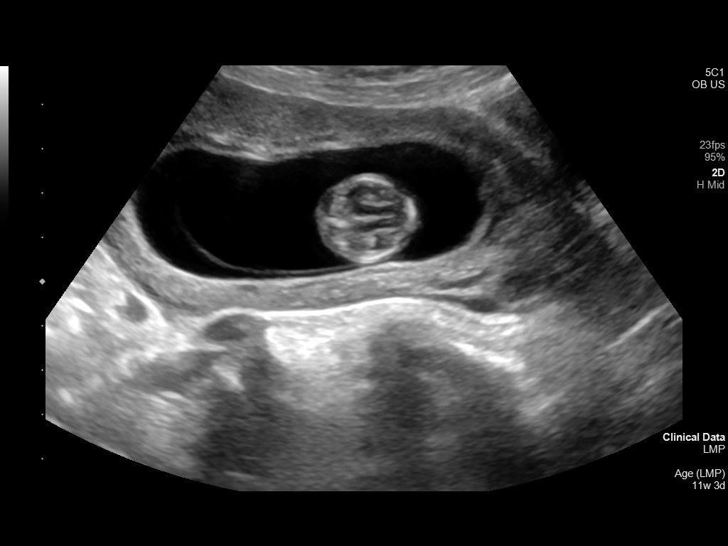
[im 19/46]
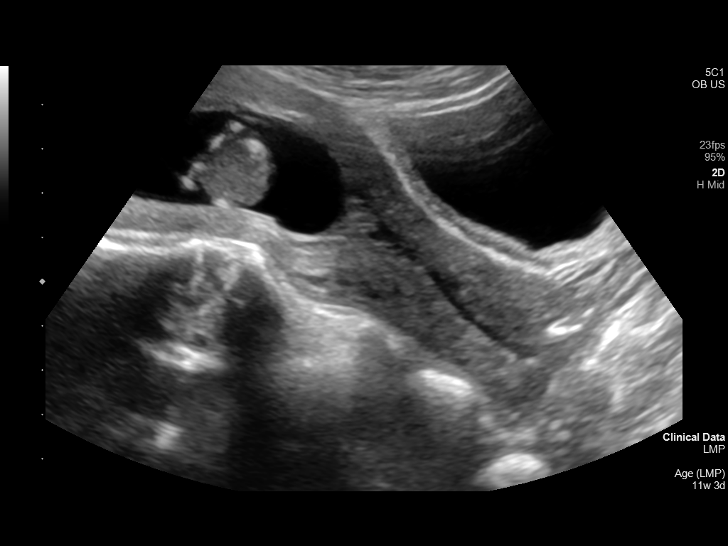
[im 22/46]
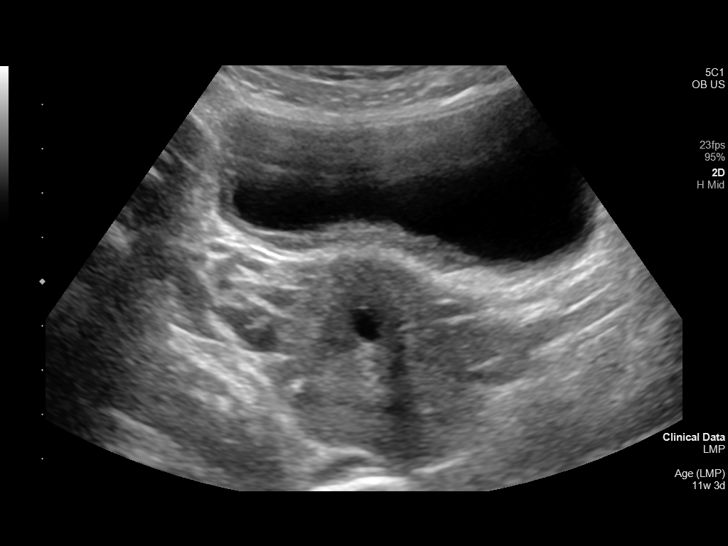
[im 26/46]
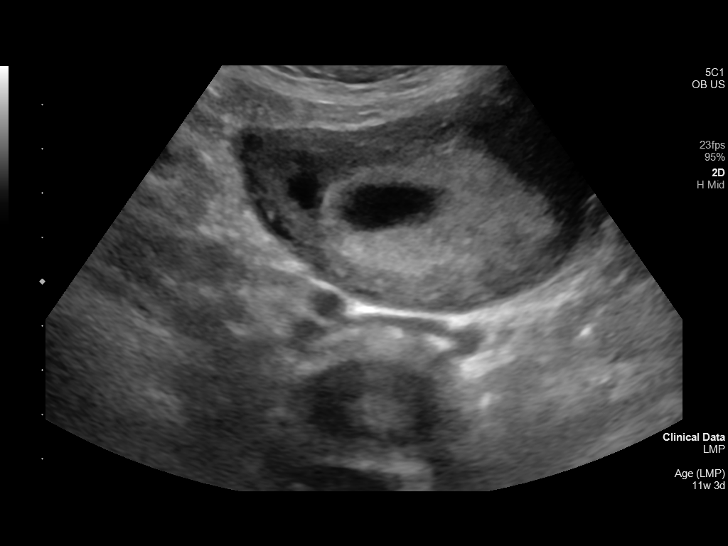
[im 29/46]
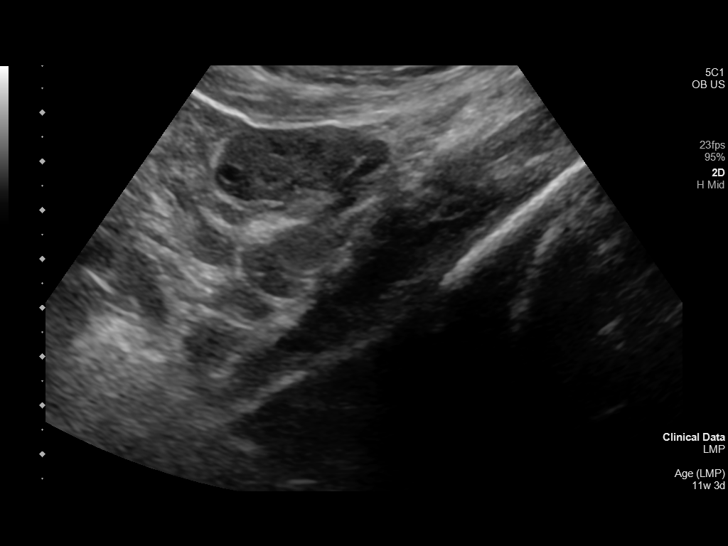
[im 32/46]
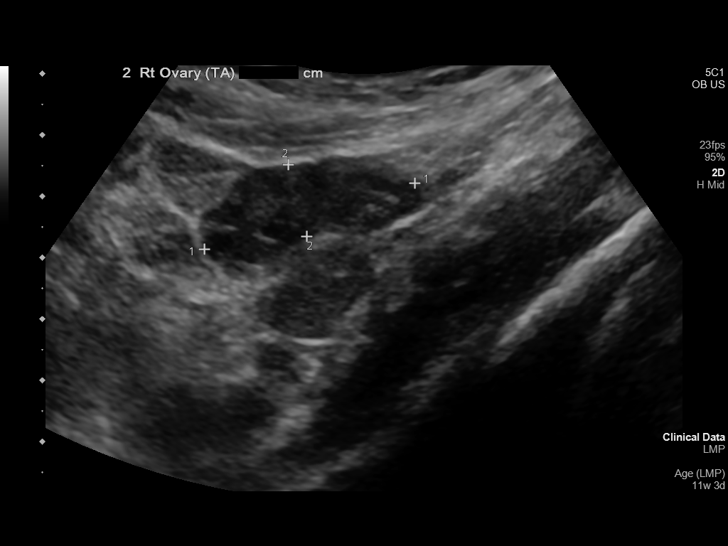
[im 36/46]
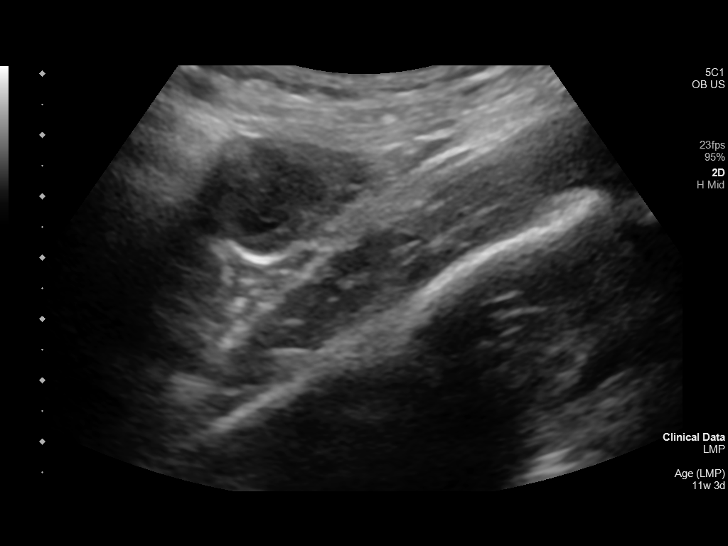
[im 39/46]
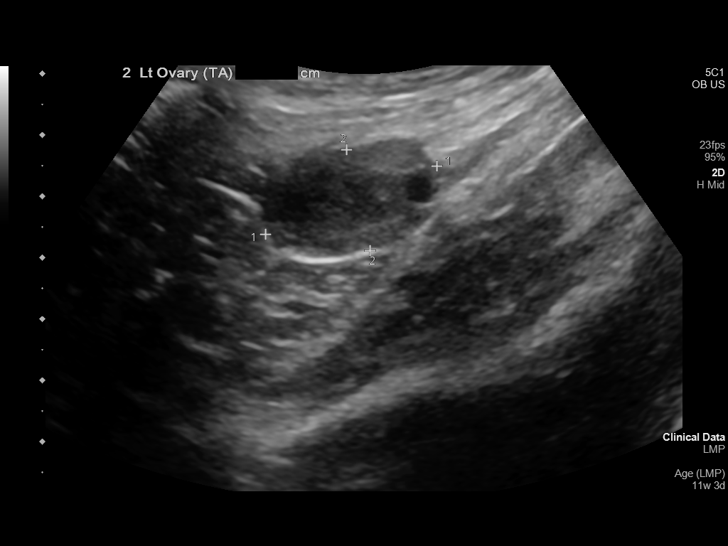
[im 42/46]
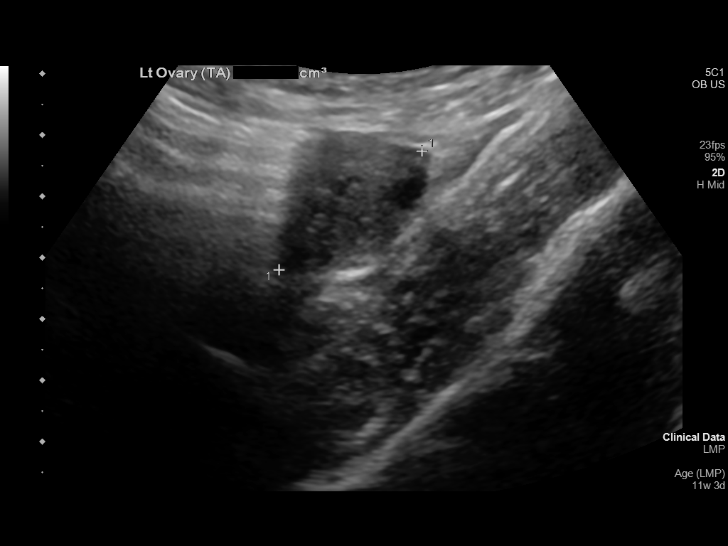
[im 46/46]
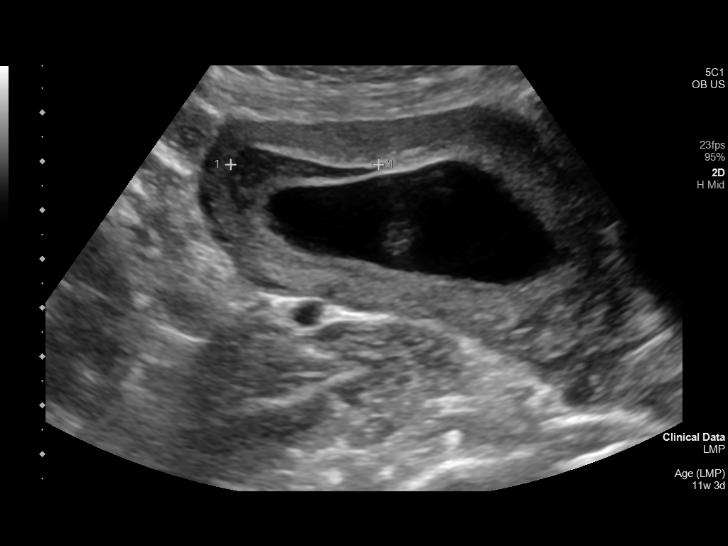

[14 of 28 positions shown; findings below may reference images not displayed]

FINDINGS: Intrauterine gestational sac: Single

Yolk sac:  Visualized.

Embryo:  Visualized.

Cardiac Activity: Visualized.

Heart Rate: 153 bpm

MSD:   mm    w     d

CRL:  48 mm   11 w   4 d                  US EDC: 04/22/2022

Subchorionic hemorrhage:  Small subchorionic hemorrhage

Maternal uterus/adnexae: No adnexal mass or free fluid.
IMPRESSION: Eleven week 4 day intrauterine pregnancy. Fetal heart rate 153 beats
per minute. Small subchorionic hemorrhage.
